# Patient Record
Sex: Female | Born: 1970 | Race: White | Hispanic: No | Smoking: Former smoker
Health system: Southern US, Community
[De-identification: ages and names within clinical notes are randomized; demographics above are authoritative.]

## PROBLEM LIST (undated history)

## (undated) DIAGNOSIS — J449 Chronic obstructive pulmonary disease, unspecified: Secondary | ICD-10-CM

## (undated) DIAGNOSIS — M1990 Unspecified osteoarthritis, unspecified site: Secondary | ICD-10-CM

## (undated) DIAGNOSIS — G43909 Migraine, unspecified, not intractable, without status migrainosus: Secondary | ICD-10-CM

## (undated) DIAGNOSIS — F319 Bipolar disorder, unspecified: Secondary | ICD-10-CM

## (undated) DIAGNOSIS — M6530 Trigger finger, unspecified finger: Secondary | ICD-10-CM

## (undated) DIAGNOSIS — G5600 Carpal tunnel syndrome, unspecified upper limb: Secondary | ICD-10-CM

## (undated) DIAGNOSIS — R011 Cardiac murmur, unspecified: Secondary | ICD-10-CM

## (undated) DIAGNOSIS — J189 Pneumonia, unspecified organism: Secondary | ICD-10-CM

## (undated) DIAGNOSIS — M722 Plantar fascial fibromatosis: Secondary | ICD-10-CM

## (undated) HISTORY — PX: COLONOSCOPY: SHX174

## (undated) HISTORY — PX: DIAGNOSTIC LAPAROSCOPY: SUR761

## (undated) HISTORY — DX: Unspecified osteoarthritis, unspecified site: M19.90

---
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
## 1989-07-20 HISTORY — PX: TUBAL LIGATION: SHX77

## 2009-01-24 ENCOUNTER — Emergency Department (HOSPITAL_COMMUNITY): Admission: EM | Admit: 2009-01-24 | Discharge: 2009-01-24 | Payer: Self-pay | Admitting: Family Medicine

## 2009-04-19 ENCOUNTER — Emergency Department (HOSPITAL_COMMUNITY): Admission: EM | Admit: 2009-04-19 | Discharge: 2009-04-19 | Payer: Self-pay | Admitting: Emergency Medicine

## 2009-06-19 ENCOUNTER — Emergency Department (HOSPITAL_COMMUNITY): Admission: EM | Admit: 2009-06-19 | Discharge: 2009-06-19 | Payer: Self-pay | Admitting: Emergency Medicine

## 2010-10-21 LAB — CBC
HCT: 39 % (ref 36.0–46.0)
Hemoglobin: 13.4 g/dL (ref 12.0–15.0)
MCHC: 34.5 g/dL (ref 30.0–36.0)
MCV: 97 fL (ref 78.0–100.0)
RDW: 13.9 % (ref 11.5–15.5)

## 2010-10-21 LAB — URINALYSIS, ROUTINE W REFLEX MICROSCOPIC
Bilirubin Urine: NEGATIVE
Glucose, UA: NEGATIVE mg/dL
Hgb urine dipstick: NEGATIVE
Ketones, ur: NEGATIVE mg/dL
Nitrite: NEGATIVE
Protein, ur: NEGATIVE mg/dL
Specific Gravity, Urine: 1.023 (ref 1.005–1.030)
Urobilinogen, UA: 0.2 mg/dL (ref 0.0–1.0)
pH: 5.5 (ref 5.0–8.0)

## 2010-10-21 LAB — BASIC METABOLIC PANEL
BUN: 11 mg/dL (ref 6–23)
Chloride: 103 mEq/L (ref 96–112)
GFR calc non Af Amer: >60 mL/min (ref >60)
Glucose, Bld: 95 mg/dL (ref 70–99)
Potassium: 3.5 mEq/L (ref 3.5–5.1)

## 2010-10-21 LAB — GLUCOSE, CAPILLARY: Glucose-Capillary: 145 mg/dL — ABNORMAL HIGH (ref 70–99)

## 2010-10-21 LAB — DIFFERENTIAL
Basophils Absolute: 0 10*3/uL (ref 0.0–0.1)
Basophils Relative: 0 % (ref 0–1)
Eosinophils Absolute: 0.3 10*3/uL (ref 0.0–0.7)
Eosinophils Relative: 4 % (ref 0–5)
Lymphocytes Relative: 36 % (ref 12–46)
Lymphs Abs: 2.7 10*3/uL (ref 0.7–4.0)
Monocytes Absolute: 0.6 10*3/uL (ref 0.1–1.0)
Monocytes Relative: 8 % (ref 3–12)
Neutro Abs: 3.9 10*3/uL (ref 1.7–7.7)
Neutrophils Relative %: 52 % (ref 43–77)

## 2010-10-21 LAB — T4: T4, Total: 6.7 ug/dL (ref 5.0–12.5)

## 2010-10-21 LAB — URINE CULTURE: Colony Count: 65000

## 2010-10-21 LAB — PREGNANCY, URINE

## 2010-12-07 ENCOUNTER — Emergency Department (HOSPITAL_COMMUNITY)
Admission: EM | Admit: 2010-12-07 | Discharge: 2010-12-07 | Disposition: A | Discharge status: Home / Self Care | Payer: Self-pay | Attending: Emergency Medicine | Admitting: Emergency Medicine

## 2010-12-07 DIAGNOSIS — H6120 Impacted cerumen, unspecified ear: Secondary | ICD-10-CM | POA: Insufficient documentation

## 2010-12-07 DIAGNOSIS — J329 Chronic sinusitis, unspecified: Secondary | ICD-10-CM | POA: Insufficient documentation

## 2011-02-03 ENCOUNTER — Emergency Department (HOSPITAL_COMMUNITY)
Admission: EM | Admit: 2011-02-03 | Discharge: 2011-02-03 | Disposition: A | Discharge status: Home / Self Care | Payer: PRIVATE HEALTH INSURANCE | Attending: Emergency Medicine | Admitting: Emergency Medicine

## 2011-02-03 ENCOUNTER — Emergency Department (HOSPITAL_COMMUNITY): Payer: PRIVATE HEALTH INSURANCE

## 2011-02-03 DIAGNOSIS — M545 Low back pain, unspecified: Secondary | ICD-10-CM | POA: Insufficient documentation

## 2011-02-03 LAB — URINALYSIS, ROUTINE W REFLEX MICROSCOPIC
Glucose, UA: NEGATIVE mg/dL
Ketones, ur: NEGATIVE mg/dL
Leukocytes, UA: NEGATIVE
Specific Gravity, Urine: 1.026 (ref 1.005–1.030)
pH: 6 (ref 5.0–8.0)

## 2012-02-14 ENCOUNTER — Emergency Department (HOSPITAL_COMMUNITY)
Admission: EM | Admit: 2012-02-14 | Discharge: 2012-02-14 | Discharge status: Against Medical Advice | Payer: PRIVATE HEALTH INSURANCE | Attending: Emergency Medicine | Admitting: Emergency Medicine

## 2012-02-14 ENCOUNTER — Encounter (HOSPITAL_COMMUNITY): Payer: Self-pay | Admitting: Emergency Medicine

## 2012-02-14 NOTE — ED Notes (Signed)
Per Chyrl Civatte, patient advocate, pt left prior to being seen by triage RN.

## 2012-02-14 NOTE — ED Notes (Deleted)
C/o pain to lower back and abd x 3 days.  Also c/o burning with urination.

## 2012-02-14 NOTE — ED Notes (Signed)
Triage information entered on wrong pt.  Corrected.  This pt has not been seen by this RN.

## 2012-11-29 ENCOUNTER — Encounter (HOSPITAL_COMMUNITY): Payer: Self-pay | Admitting: *Deleted

## 2012-11-29 ENCOUNTER — Emergency Department (HOSPITAL_COMMUNITY)
Admission: EM | Admit: 2012-11-29 | Discharge: 2012-11-29 | Disposition: A | Discharge status: Home / Self Care | Payer: PRIVATE HEALTH INSURANCE | Source: Home / Self Care | Attending: Family Medicine | Admitting: Family Medicine

## 2012-11-29 DIAGNOSIS — J302 Other seasonal allergic rhinitis: Secondary | ICD-10-CM

## 2012-11-29 DIAGNOSIS — J309 Allergic rhinitis, unspecified: Secondary | ICD-10-CM

## 2012-11-29 MED ORDER — FEXOFENADINE HCL 180 MG PO TABS: 180.0000 mg | ORAL_TABLET | Freq: Every day | ORAL | Status: DC

## 2012-11-29 MED ORDER — TRIAMCINOLONE ACETONIDE 40 MG/ML IJ SUSP
INTRAMUSCULAR | Status: AC
  Filled 2012-11-29: qty 5

## 2012-11-29 MED ORDER — FLUTICASONE PROPIONATE 50 MCG/ACT NA SUSP: 1.0000 | Freq: Two times a day (BID) | NASAL | Status: DC

## 2012-11-29 MED ORDER — METHYLPREDNISOLONE ACETATE 40 MG/ML IJ SUSP
80.0000 mg | Freq: Once | INTRAMUSCULAR | Status: AC
  Administered 2012-11-29: 80 mg via INTRAMUSCULAR

## 2012-11-29 MED ORDER — TRIAMCINOLONE ACETONIDE 40 MG/ML IJ SUSP
40.0000 mg | Freq: Once | INTRAMUSCULAR | Status: AC
  Administered 2012-11-29: 40 mg via INTRAMUSCULAR

## 2012-11-29 NOTE — ED Notes (Signed)
Onset yesterday swelling and  sore throat.  Today she vomited x 1  And she saw some tinged sputum.  She denies fever at home

## 2012-11-29 NOTE — ED Provider Notes (Signed)
History     CSN: [id number]  Arrival date & time 11/29/12  1145   First MD Initiated Contact with Patient 11/29/12 1250      Chief Complaint  Patient presents with  . Sore Throat    (Consider location/radiation/quality/duration/timing/severity/associated sxs/prior treatment) Patient is a 42 y.o. female presenting with pharyngitis. The history is provided by the patient.  Sore Throat This is a new problem. The current episode started more than 2 days ago. The problem has not changed since onset.The symptoms are aggravated by swallowing.    History reviewed. No pertinent past medical history.  Past Surgical History  Procedure Laterality Date  . Cesarean section      No family history on file.  History  Substance Use Topics  . Smoking status: Current Every Day Smoker -- 2.00 packs/day    Types: Cigarettes  . Smokeless tobacco: Not on file  . Alcohol Use: No    OB History   Grav Para Term Preterm Abortions TAB SAB Ect Mult Living                  Review of Systems  Constitutional: Negative.  Negative for fever and chills.  HENT: Positive for congestion, rhinorrhea and postnasal drip.   Respiratory: Negative.   Cardiovascular: Negative.     Allergies  Review of patient's allergies indicates no known allergies.  Home Medications  No current outpatient prescriptions on file.  BP 120/85  Pulse 85  Temp(Src) 98.2 F (36.8 C) (Oral)  Resp 18  SpO2 96%  LMP 11/28/2012  Physical Exam  Nursing note and vitals reviewed. Constitutional: She is oriented to person, place, and time. She appears well-developed and well-nourished.  HENT:  Head: Normocephalic.  Right Ear: External ear normal.  Nose: Mucosal edema and rhinorrhea present.  Mouth/Throat: Oropharynx is clear and moist.  Neck: Normal range of motion. Neck supple.  Cardiovascular: Normal rate and regular rhythm.   Pulmonary/Chest: Breath sounds normal.  Lymphadenopathy:    She has no cervical  adenopathy.  Neurological: She is alert and oriented to person, place, and time.  Skin: Skin is warm and dry.    ED Course  Procedures (including critical care time)  Labs Reviewed - No data to display No results found.   No diagnosis found.    MDM          Linna Hoff, MD 11/29/12 1311

## 2013-01-10 ENCOUNTER — Encounter (HOSPITAL_COMMUNITY): Payer: Self-pay | Admitting: Emergency Medicine

## 2013-01-10 ENCOUNTER — Emergency Department (HOSPITAL_COMMUNITY)
Admission: EM | Admit: 2013-01-10 | Discharge: 2013-01-10 | Disposition: A | Discharge status: Home / Self Care | Payer: PRIVATE HEALTH INSURANCE | Source: Home / Self Care

## 2013-01-10 ENCOUNTER — Encounter (HOSPITAL_COMMUNITY): Payer: Self-pay

## 2013-01-10 ENCOUNTER — Emergency Department (HOSPITAL_COMMUNITY): Payer: PRIVATE HEALTH INSURANCE

## 2013-01-10 ENCOUNTER — Emergency Department (HOSPITAL_COMMUNITY)
Admission: EM | Admit: 2013-01-10 | Discharge: 2013-01-10 | Disposition: A | Discharge status: Home / Self Care | Payer: PRIVATE HEALTH INSURANCE | Attending: Emergency Medicine | Admitting: Emergency Medicine

## 2013-01-10 DIAGNOSIS — IMO0002 Reserved for concepts with insufficient information to code with codable children: Secondary | ICD-10-CM | POA: Insufficient documentation

## 2013-01-10 DIAGNOSIS — R110 Nausea: Secondary | ICD-10-CM | POA: Insufficient documentation

## 2013-01-10 DIAGNOSIS — K8020 Calculus of gallbladder without cholecystitis without obstruction: Secondary | ICD-10-CM | POA: Insufficient documentation

## 2013-01-10 DIAGNOSIS — F17210 Nicotine dependence, cigarettes, uncomplicated: Secondary | ICD-10-CM

## 2013-01-10 DIAGNOSIS — R0609 Other forms of dyspnea: Secondary | ICD-10-CM | POA: Insufficient documentation

## 2013-01-10 DIAGNOSIS — R079 Chest pain, unspecified: Secondary | ICD-10-CM

## 2013-01-10 DIAGNOSIS — R0989 Other specified symptoms and signs involving the circulatory and respiratory systems: Secondary | ICD-10-CM | POA: Insufficient documentation

## 2013-01-10 DIAGNOSIS — R1013 Epigastric pain: Secondary | ICD-10-CM

## 2013-01-10 DIAGNOSIS — F17200 Nicotine dependence, unspecified, uncomplicated: Secondary | ICD-10-CM | POA: Insufficient documentation

## 2013-01-10 LAB — COMPREHENSIVE METABOLIC PANEL
ALT: 11 U/L (ref 0–35)
AST: 14 U/L (ref 0–37)
Albumin: 3.4 g/dL — ABNORMAL LOW (ref 3.5–5.2)
Alkaline Phosphatase: 68 U/L (ref 39–117)
BUN: 12 mg/dL (ref 6–23)
Potassium: 4.4 mEq/L (ref 3.5–5.1)
Sodium: 139 mEq/L (ref 135–145)
Total Protein: 6.8 g/dL (ref 6.0–8.3)

## 2013-01-10 LAB — CBC
Platelets: 192 10*3/uL (ref 150–400)
RDW: 13.9 % (ref 11.5–15.5)
WBC: 8.9 10*3/uL (ref 4.0–10.5)

## 2013-01-10 LAB — LIPASE, BLOOD: Lipase: 58 U/L (ref 11–59)

## 2013-01-10 LAB — TROPONIN I: Troponin I: <0.3 ng/mL (ref <0.30)

## 2013-01-10 MED ORDER — ALBUTEROL SULFATE (5 MG/ML) 0.5% IN NEBU
5.0000 mg | INHALATION_SOLUTION | Freq: Once | RESPIRATORY_TRACT | Status: AC
  Administered 2013-01-10: 5 mg via RESPIRATORY_TRACT
  Filled 2013-01-10 (×2): qty 1

## 2013-01-10 MED ORDER — GI COCKTAIL ~~LOC~~
30.0000 mL | Freq: Once | ORAL | Status: AC
  Administered 2013-01-10: 30 mL via ORAL
  Filled 2013-01-10: qty 30

## 2013-01-10 MED ORDER — ASPIRIN 81 MG PO CHEW
CHEWABLE_TABLET | ORAL | Status: AC
  Filled 2013-01-10: qty 4

## 2013-01-10 MED ORDER — ONDANSETRON HCL 4 MG/2ML IJ SOLN
4.0000 mg | Freq: Once | INTRAMUSCULAR | Status: AC
  Administered 2013-01-10: 4 mg via INTRAVENOUS
  Filled 2013-01-10: qty 2

## 2013-01-10 MED ORDER — OXYCODONE-ACETAMINOPHEN 5-325 MG PO TABS: 1.0000 | ORAL_TABLET | ORAL | Status: DC | PRN

## 2013-01-10 MED ORDER — MORPHINE SULFATE 4 MG/ML IJ SOLN
6.0000 mg | Freq: Once | INTRAMUSCULAR | Status: AC
  Administered 2013-01-10: 6 mg via INTRAVENOUS
  Filled 2013-01-10: qty 2

## 2013-01-10 MED ORDER — MORPHINE SULFATE 4 MG/ML IJ SOLN
6.0000 mg | Freq: Once | INTRAMUSCULAR | Status: DC
  Filled 2013-01-10: qty 2

## 2013-01-10 MED ORDER — ONDANSETRON 8 MG PO TBDP: 8.0000 mg | ORAL_TABLET | Freq: Three times a day (TID) | ORAL | Status: DC | PRN

## 2013-01-10 MED ORDER — ASPIRIN 81 MG PO CHEW
324.0000 mg | CHEWABLE_TABLET | Freq: Once | ORAL | Status: AC
  Administered 2013-01-10: 324 mg via ORAL

## 2013-01-10 MED ORDER — SODIUM CHLORIDE 0.9 % IV SOLN
Freq: Once | INTRAVENOUS | Status: AC
  Administered 2013-01-10: 12:00:00 via INTRAVENOUS

## 2013-01-10 NOTE — ED Provider Notes (Signed)
History    CSN: [id number] Arrival date & time 01/10/13  1215  First MD Initiated Contact with Patient 01/10/13 1219     Chief Complaint  Patient presents with  . Chest Pain   HPI Patient presents of worsening upper abdominal pain over the past 3-4 days with associated nausea.  She denies vomiting.  No diarrhea.  No fevers or chills.  She reports eating food seems to make her pain worse.  She's never been told she has gallstones before.  No history of cardiac disease.  No chest pain or shortness of breath.  Symptoms are mild to moderate in severity.  Symptoms seem to be improving at this time.  She's had the urge of care earlier today and transferred here for questionable cardiac related chest pain.  EKG normal sinus rhythm on arrival to ER.   History reviewed. No pertinent past medical history. Past Surgical History  Procedure Laterality Date  . Cesarean section     No family history on file. History  Substance Use Topics  . Smoking status: Current Every Day Smoker -- 2.00 packs/day    Types: Cigarettes  . Smokeless tobacco: Not on file  . Alcohol Use: No   OB History   Grav Para Term Preterm Abortions TAB SAB Ect Mult Living                 Review of Systems  All other systems reviewed and are negative.    Allergies  Review of patient's allergies indicates no known allergies.  Home Medications   Current Outpatient Rx  Name  Route  Sig  Dispense  Refill  . fexofenadine (ALLEGRA) 180 MG tablet   Oral   Take 1 tablet (180 mg total) by mouth daily.   30 tablet   1   . fluticasone (FLONASE) 50 MCG/ACT nasal spray   Nasal   Place 1 spray into the nose 2 (two) times daily.   1 g   2    BP 108/68  Pulse 69  Temp(Src) 97.9 F (36.6 C) (Oral)  Resp 14  SpO2 100%  LMP 01/10/2013 Physical Exam  Nursing note and vitals reviewed. Constitutional: She is oriented to person, place, and time. She appears well-developed and well-nourished. No distress.  HENT:   Head: Normocephalic and atraumatic.  Eyes: EOM are normal.  Neck: Normal range of motion.  Cardiovascular: Normal rate, regular rhythm and normal heart sounds.   Pulmonary/Chest: Effort normal and breath sounds normal.  Abdominal: Soft. She exhibits no distension. There is no tenderness.  Musculoskeletal: Normal range of motion.  Neurological: She is alert and oriented to person, place, and time.  Skin: Skin is warm and dry.  Psychiatric: She has a normal mood and affect. Judgment normal.    ED Course  Procedures (including critical care time)   Date: 01/10/2013  Rate: 64  Rhythm: normal sinus rhythm  QRS Axis: normal  Intervals: normal  ST/T Wave abnormalities: normal  Conduction Disutrbances: none  Narrative Interpretation:   Old EKG Reviewed: No significant changes noted     Labs Reviewed  COMPREHENSIVE METABOLIC PANEL - Abnormal; Notable for the following:    Albumin 3.4 (*)    Total Bilirubin 0.1 (*)    All other components within normal limits  CBC  TROPONIN I  LIPASE, BLOOD   Dg Chest 2 View  01/10/2013   *RADIOLOGY REPORT*  Clinical Data: 4 days of chest pain.  CHEST - 2 VIEW  Comparison: 06/19/2009.  Findings:  Chronic bronchitic changes.  Cardiopericardial silhouette and mediastinal contours appear within normal limits.  No airspace disease or pleural effusion.  No focal consolidation.  IMPRESSION: Chronic bronchitic changes.  No acute cardiopulmonary disease.   Original Report Authenticated By: Andreas Newport, M.D.   US Abdomen Complete  01/10/2013   *RADIOLOGY REPORT*  Clinical Data:  Abdominal pain  ABDOMINAL ULTRASOUND COMPLETE  Comparison:  None.  Findings:  Gallbladder:  08/03 0.2 cm gallstone is present towards the neck of the gallbladder.  No wall thickening or pericholecystic fluid.  No Murphy's sign.  Common Bile Duct:  Within normal limits in caliber.  Liver: No focal mass lesion identified.  Within normal limits in parenchymal echogenicity.  IVC:   Appears normal.  Pancreas:  The pancreas was completely obscured by overlying bowel gas.  Spleen:  Within normal limits in size and echotexture.  Right kidney:  Normal in size and parenchymal echogenicity.  No evidence of mass or hydronephrosis.  Left kidney:  Normal in size and parenchymal echogenicity.  No evidence of mass or hydronephrosis.  Abdominal Aorta:  Portions of the aorta were obscured by overlying bowel gas.  No obvious aneurysm.  IMPRESSION: Cholelithiasis.  Pancreas and portions of the aorta were obscured.   Original Report Authenticated By: Jolaine Click, M.D.   I personally reviewed the imaging tests through PACS system I reviewed available ER/hospitalization records through the EMR   1. Cholelithiasis     MDM  4:56 PM Patient feels much better at this time.  This is cholelithiasis without evidence of cholecystitis.  Feeling better at this time.  General surgery followup.  She understands return to the ER for new or worsening symptoms.  Lyanne Co, MD 01/10/13 [phone]

## 2013-01-10 NOTE — ED Notes (Addendum)
Pain in middle of chest Head feels swimmy headed.   Pain started 4 days ago, pain is constant, feels "pressure".  Reports nausea no vomiting.

## 2013-01-10 NOTE — ED Provider Notes (Signed)
   History    CSN: [id number] Arrival date & time 01/10/13  1038  First MD Initiated Contact with Patient 01/10/13 1055     Chief Complaint  Patient presents with  . Chest Pain   (Consider location/radiation/quality/duration/timing/severity/associated sxs/prior Treatment) HPI Comments: 42 year old female presents today with a four-day history of anterior chest pressure. She complains of pain across the anterior chest and high in the epigastrium. She states it is primarily substernal. She occasionally has episodes of diaphoresis and lightheadedness. The pain is often worse with movement lifting and exertion. She has been smoking 1-1/2 packs per day for 30 years.  History reviewed. No pertinent past medical history. Past Surgical History  Procedure Laterality Date  . Cesarean section     No family history on file. History  Substance Use Topics  . Smoking status: Current Every Day Smoker -- 2.00 packs/day    Types: Cigarettes  . Smokeless tobacco: Not on file  . Alcohol Use: No   OB History   Grav Para Term Preterm Abortions TAB SAB Ect Mult Living                 Review of Systems  Constitutional: Positive for activity change. Negative for fever.  HENT: Negative.   Respiratory: Positive for chest tightness and shortness of breath. Negative for cough.   Cardiovascular: Positive for chest pain. Negative for leg swelling.  Gastrointestinal: Positive for nausea. Negative for vomiting.  Genitourinary: Negative.   Skin: Negative.   Neurological: Positive for dizziness and light-headedness. Negative for syncope and speech difficulty.    Allergies  Review of patient's allergies indicates no known allergies.  Home Medications   Current Outpatient Rx  Name  Route  Sig  Dispense  Refill  . fexofenadine (ALLEGRA) 180 MG tablet   Oral   Take 1 tablet (180 mg total) by mouth daily.   30 tablet   1   . fluticasone (FLONASE) 50 MCG/ACT nasal spray   Nasal   Place 1 spray into  the nose 2 (two) times daily.   1 g   2    BP 127/85  Pulse 88  Temp(Src) 98.4 F (36.9 C) (Oral)  Resp 18  SpO2 97%  LMP 01/10/2013 Physical Exam  Nursing note and vitals reviewed. Constitutional: She is oriented to person, place, and time. She appears well-developed and well-nourished. No distress.  Neck: Normal range of motion. Neck supple.  Cardiovascular: Normal rate, regular rhythm and normal heart sounds.   Pulmonary/Chest: Effort normal. No respiratory distress. She has no wheezes.  Abdominal: She exhibits no mass. There is tenderness.  There is tenderness high in the epigastrium.  Musculoskeletal:  No reproducible chest wall tenderness.  Neurological: She is alert and oriented to person, place, and time.  Skin: Skin is warm and dry. No rash noted.    ED Course  Procedures (including critical care time) Labs Reviewed - No data to display No results found. 1. Chest pain   2. Epigastric pain   3. Cigarette smoker two packs a day or less     MDM  42 year old female who is a current smoker now for over 30 years is complaining of anterior chest pressure and heaviness for the past 4 days. She has had some nausea but no vomiting as well as diaphoresis and lightheadedness. She is to be transferred to the emergency department for chest pain evaluation. Currently stable. EKG: NSR. No ST-T changes. No ischemic changes. Normal axis  Hayden Rasmussen, NP 01/10/13 1133

## 2013-01-10 NOTE — ED Notes (Signed)
Patient transported to Ultrasound 

## 2013-01-10 NOTE — ED Notes (Addendum)
Pt c/o of CP worsening over the last 4 days.  Pt presented to [facility] today and transferred to ED.  Pt A and O x 4.  Pt given 324 mg Aspirin at [facility].

## 2013-01-10 NOTE — ED Notes (Signed)
Notified [facility] 

## 2013-01-23 ENCOUNTER — Ambulatory Visit (INDEPENDENT_AMBULATORY_CARE_PROVIDER_SITE_OTHER): Payer: PRIVATE HEALTH INSURANCE | Admitting: Surgery

## 2013-01-23 ENCOUNTER — Encounter (INDEPENDENT_AMBULATORY_CARE_PROVIDER_SITE_OTHER): Payer: Self-pay | Admitting: Surgery

## 2013-01-23 VITALS — BP 118/70 | HR 72 | Temp 99.0°F | Resp 16 | Ht 64.0 in | Wt 193.0 lb

## 2013-01-23 DIAGNOSIS — K8010 Calculus of gallbladder with chronic cholecystitis without obstruction: Secondary | ICD-10-CM | POA: Insufficient documentation

## 2013-01-23 NOTE — Progress Notes (Signed)
Patient ID: Crystal Fuentes, female   DOB: 10/19/1970, 42 y.o.   MRN: [id number]  Chief Complaint  Patient presents with  . New Evaluation    eval chole    HPI Crystal Fuentes is a 42 y.o. female.  Referred by Dr. Azalia Bilis - [place] for gallbladder disease HPI This is a 42 year old female who presents with a two-week history of severe epigastric pain with radiation around her right side to her back. This was associated with nausea and abdominal bloating. She denies any diarrhea. She actually has been constipated. She was evaluated at [facility] part was found to have a gallstone but no sign cholecystitis. She continues to have some abdominal pain that is exacerbated by eating.  Past Medical History  Diagnosis Date  . Arthritis     Past Surgical History  Procedure Laterality Date  . Cesarean section    . Colonoscopy  [id number]    Family History  Problem Relation Age of Onset  . Heart disease Mother   . Hypertension Mother   . Hyperlipidemia Mother   . Arthritis Mother   . Asthma Mother   . Cancer Father   . Hypertension Father   . Hyperlipidemia Father   . Hypertension Sister   . Hyperlipidemia Sister   . Hypertension Brother   . Hyperlipidemia Brother     Social History History  Substance Use Topics  . Smoking status: Current Some Day Smoker -- 1.50 packs/day    Types: Cigarettes  . Smokeless tobacco: Never Used  . Alcohol Use: No    No Known Allergies  Current Outpatient Prescriptions  Medication Sig Dispense Refill  . ondansetron (ZOFRAN ODT) 8 MG disintegrating tablet Take 1 tablet (8 mg total) by mouth every 8 (eight) hours as needed for nausea.  12 tablet  0  . oxyCODONE-acetaminophen (PERCOCET/ROXICET) 5-325 MG per tablet Take 1 tablet by mouth every 4 (four) hours as needed for pain.  20 tablet  0   No current facility-administered medications for this visit.    Review of Systems Review of Systems  Constitutional: Negative for fever, chills and unexpected weight  change.  HENT: Negative for hearing loss, congestion, sore throat, trouble swallowing and voice change.   Eyes: Negative for visual disturbance.  Respiratory: Negative for cough and wheezing.   Cardiovascular: Negative for chest pain, palpitations and leg swelling.  Gastrointestinal: Positive for nausea, abdominal pain, constipation and abdominal distention. Negative for vomiting, diarrhea, blood in stool and anal bleeding.  Genitourinary: Negative for hematuria, vaginal bleeding and difficulty urinating.  Musculoskeletal: Negative for arthralgias.  Skin: Negative for rash and wound.  Neurological: Negative for seizures, syncope and headaches.  Hematological: Negative for adenopathy. Does not bruise/bleed easily.  Psychiatric/Behavioral: Negative for confusion.    Blood pressure 118/70, pulse 72, temperature 99 F (37.2 C), temperature source Temporal, resp. rate 16, height 5\' 4"  (1.626 m), weight 193 lb (87.544 kg), last menstrual period 01/10/2013.  Physical Exam Physical Exam WDWN in NAD HEENT:  EOMI, sclera anicteric Neck:  No masses, no thyromegaly Lungs:  CTA bilaterally; normal respiratory effort CV:  Regular rate and rhythm; no murmurs Abd:  +bowel sounds, soft, mild epigastric and RUQ tenderness Ext:  Well-perfused; no edema Skin:  Warm, dry; no sign of jaundice  Data Reviewed Lab Results  Component Value Date   WBC 8.9 01/10/2013   HGB 13.4 01/10/2013   HCT 39.1 01/10/2013   MCV 93.5 01/10/2013   PLT 192 01/10/2013   Lab Results  Component Value  Date   ALT 11 01/10/2013   AST 14 01/10/2013   ALKPHOS 68 01/10/2013   BILITOT 0.1* 01/10/2013   Lab Results  Component Value Date   CREATININE 0.67 01/10/2013   BUN 12 01/10/2013   NA 139 01/10/2013   K 4.4 01/10/2013   CL 104 01/10/2013   CO2 28 01/10/2013   *RADIOLOGY REPORT*  Clinical Data: Abdominal pain  ABDOMINAL ULTRASOUND COMPLETE  Comparison: None.  Findings:  Gallbladder: 08/03 0.2 cm gallstone is present  towards the neck of  the gallbladder. No wall thickening or pericholecystic fluid. No  Murphy's sign.  Common Bile Duct: Within normal limits in caliber.  Liver: No focal mass lesion identified. Within normal limits in  parenchymal echogenicity.  IVC: Appears normal.  Pancreas: The pancreas was completely obscured by overlying bowel  gas.  Spleen: Within normal limits in size and echotexture.  Right kidney: Normal in size and parenchymal echogenicity. No  evidence of mass or hydronephrosis.  Left kidney: Normal in size and parenchymal echogenicity. No  evidence of mass or hydronephrosis.  Abdominal Aorta: Portions of the aorta were obscured by overlying  bowel gas. No obvious aneurysm.  IMPRESSION:  Cholelithiasis.  Pancreas and portions of the aorta were obscured.  Original Report Authenticated By: Jolaine Click, M.D.      IMP:  Chronic calculus cholecystitis     Plan    Laparoscopic cholecystectomy with intraoperative cholangiogram. The surgical procedure has been discussed with the patient.  Potential risks, benefits, alternative treatments, and expected outcomes have been explained.  All of the patient's questions at this time have been answered.  The likelihood of reaching the patient's treatment goal is good.  The patient understand the proposed surgical procedure and wishes to proceed.         Harrington Jobe K. 01/23/2013, 2:14 PM

## 2013-01-24 NOTE — ED Provider Notes (Signed)
Medical screening examination/treatment/procedure(s) were performed by resident physician or non-physician practitioner and as supervising physician I was immediately available for consultation/collaboration.   Marcin Holte DOUGLAS MD.   Jaxten Brosh D Anjolie Majer, MD 01/24/13 0841 

## 2013-01-26 ENCOUNTER — Encounter (HOSPITAL_COMMUNITY): Payer: Self-pay

## 2013-01-26 ENCOUNTER — Emergency Department (HOSPITAL_COMMUNITY)
Admission: EM | Admit: 2013-01-26 | Discharge: 2013-01-26 | Disposition: A | Discharge status: Home / Self Care | Payer: PRIVATE HEALTH INSURANCE | Attending: Emergency Medicine | Admitting: Emergency Medicine

## 2013-01-26 ENCOUNTER — Telehealth (INDEPENDENT_AMBULATORY_CARE_PROVIDER_SITE_OTHER): Payer: Self-pay

## 2013-01-26 DIAGNOSIS — K8050 Calculus of bile duct without cholangitis or cholecystitis without obstruction: Secondary | ICD-10-CM

## 2013-01-26 DIAGNOSIS — F17200 Nicotine dependence, unspecified, uncomplicated: Secondary | ICD-10-CM | POA: Insufficient documentation

## 2013-01-26 DIAGNOSIS — K8020 Calculus of gallbladder without cholecystitis without obstruction: Secondary | ICD-10-CM | POA: Insufficient documentation

## 2013-01-26 DIAGNOSIS — M1990 Unspecified osteoarthritis, unspecified site: Secondary | ICD-10-CM | POA: Insufficient documentation

## 2013-01-26 DIAGNOSIS — Z3202 Encounter for pregnancy test, result negative: Secondary | ICD-10-CM | POA: Insufficient documentation

## 2013-01-26 LAB — COMPREHENSIVE METABOLIC PANEL
Albumin: 3.7 g/dL (ref 3.5–5.2)
BUN: 17 mg/dL (ref 6–23)
CO2: 30 mEq/L (ref 19–32)
Chloride: 103 mEq/L (ref 96–112)
Creatinine, Ser: 0.7 mg/dL (ref 0.50–1.10)
GFR calc Af Amer: >90 mL/min (ref >90)
GFR calc non Af Amer: >90 mL/min (ref >90)
Total Bilirubin: 0.1 mg/dL — ABNORMAL LOW (ref 0.3–1.2)

## 2013-01-26 LAB — CBC WITH DIFFERENTIAL/PLATELET
Basophils Relative: 0 % (ref 0–1)
HCT: 38.2 % (ref 36.0–46.0)
Hemoglobin: 13.3 g/dL (ref 12.0–15.0)
MCHC: 34.8 g/dL (ref 30.0–36.0)
Monocytes Absolute: 0.7 10*3/uL (ref 0.1–1.0)
Monocytes Relative: 7 % (ref 3–12)
Neutro Abs: 6 10*3/uL (ref 1.7–7.7)

## 2013-01-26 LAB — URINE MICROSCOPIC-ADD ON

## 2013-01-26 LAB — URINALYSIS, ROUTINE W REFLEX MICROSCOPIC
Ketones, ur: 15 mg/dL — AB
Leukocytes, UA: NEGATIVE
Nitrite: NEGATIVE
Protein, ur: NEGATIVE mg/dL
Urobilinogen, UA: 1 mg/dL (ref 0.0–1.0)

## 2013-01-26 LAB — LIPASE, BLOOD: Lipase: 49 U/L (ref 11–59)

## 2013-01-26 MED ORDER — ONDANSETRON HCL 4 MG/2ML IJ SOLN
4.0000 mg | Freq: Once | INTRAMUSCULAR | Status: DC
  Filled 2013-01-26: qty 2

## 2013-01-26 MED ORDER — OXYCODONE-ACETAMINOPHEN 5-325 MG PO TABS: 1.0000 | ORAL_TABLET | ORAL | Status: DC | PRN

## 2013-01-26 MED ORDER — MORPHINE SULFATE 4 MG/ML IJ SOLN
4.0000 mg | Freq: Once | INTRAMUSCULAR | Status: DC
  Filled 2013-01-26: qty 1

## 2013-01-26 MED ORDER — MORPHINE SULFATE 4 MG/ML IJ SOLN
4.0000 mg | Freq: Once | INTRAMUSCULAR | Status: AC
  Administered 2013-01-26: 4 mg via INTRAVENOUS

## 2013-01-26 NOTE — ED Provider Notes (Signed)
History    CSN: [id number] Arrival date & time 01/26/13  1410  First MD Initiated Contact with Patient 01/26/13 1610     No chief complaint on file.  (Consider location/radiation/quality/duration/timing/severity/associated sxs/prior Treatment) HPI Crystal Fuentes is a 42 y.o. female who presents to ED with complaint of right upper abdominal pain. States pain started originally several weeks ago. States was seen here, diagnosed with cholelithiasis. States has an apt for 7/23 to have gallbladder removed. States pain medications for her pain, but since last night, pain has worsened. Pt reports pain is sharp. Admits to nausea, and vomiting. Last normal bowel movement yesterday. Pt denies fever, chills, malaise. No other complaints.   Past Medical History  Diagnosis Date  . Arthritis    Past Surgical History  Procedure Laterality Date  . Cesarean section    . Colonoscopy  [id number]   Family History  Problem Relation Age of Onset  . Heart disease Mother   . Hypertension Mother   . Hyperlipidemia Mother   . Arthritis Mother   . Asthma Mother   . Cancer Father   . Hypertension Father   . Hyperlipidemia Father   . Hypertension Sister   . Hyperlipidemia Sister   . Hypertension Brother   . Hyperlipidemia Brother    History  Substance Use Topics  . Smoking status: Current Some Day Smoker -- 1.50 packs/day    Types: Cigarettes  . Smokeless tobacco: Never Used  . Alcohol Use: No   OB History   Grav Para Term Preterm Abortions TAB SAB Ect Mult Living                 Review of Systems  Constitutional: Negative for fever and chills.  HENT: Negative for neck pain and neck stiffness.   Respiratory: Negative.   Cardiovascular: Negative.   Gastrointestinal: Positive for nausea, vomiting and abdominal pain. Negative for diarrhea, constipation and blood in stool.  Genitourinary: Negative for dysuria.  Musculoskeletal: Negative.   Neurological: Negative for weakness and numbness.     Allergies  Review of patient's allergies indicates no known allergies.  Home Medications   Current Outpatient Rx  Name  Route  Sig  Dispense  Refill  . ondansetron (ZOFRAN ODT) 8 MG disintegrating tablet   Oral   Take 1 tablet (8 mg total) by mouth every 8 (eight) hours as needed for nausea.   12 tablet   0   . oxyCODONE-acetaminophen (PERCOCET/ROXICET) 5-325 MG per tablet   Oral   Take 1 tablet by mouth every 4 (four) hours as needed for pain.   20 tablet   0    BP 134/77  Pulse 86  Temp(Src) 98.9 F (37.2 C) (Oral)  Resp 16  SpO2 96%  LMP 01/10/2013 Physical Exam  Nursing note and vitals reviewed. Constitutional: She is oriented to person, place, and time. She appears well-developed and well-nourished. No distress.  Eyes: Conjunctivae are normal.  Neck: Neck supple.  Cardiovascular: Normal rate, regular rhythm and normal heart sounds.   Pulmonary/Chest: Effort normal and breath sounds normal. No respiratory distress. She has no wheezes. She has no rales.  Abdominal: Soft. Bowel sounds are normal. She exhibits no distension. There is tenderness. There is rebound. There is no guarding.  RUQ tenderness  Musculoskeletal: She exhibits no edema.  Neurological: She is alert and oriented to person, place, and time.    ED Course  Procedures (including critical care time  Results for orders placed during the hospital encounter of 01/26/13  CBC WITH DIFFERENTIAL      Result Value Range   WBC 9.7  4.0 - 10.5 K/uL   RBC 4.07  3.87 - 5.11 MIL/uL   Hemoglobin 13.3  12.0 - 15.0 g/dL   HCT 16.1  09.6 - 04.5 %   MCV 93.9  78.0 - 100.0 fL   MCH 32.7  26.0 - 34.0 pg   MCHC 34.8  30.0 - 36.0 g/dL   RDW 40.9  81.1 - 91.4 %   Platelets 188  150 - 400 K/uL   Neutrophils Relative % 61  43 - 77 %   Neutro Abs 6.0  1.7 - 7.7 K/uL   Lymphocytes Relative 29  12 - 46 %   Lymphs Abs 2.8  0.7 - 4.0 K/uL   Monocytes Relative 7  3 - 12 %   Monocytes Absolute 0.7  0.1 - 1.0 K/uL    Eosinophils Relative 3  0 - 5 %   Eosinophils Absolute 0.2  0.0 - 0.7 K/uL   Basophils Relative 0  0 - 1 %   Basophils Absolute 0.0  0.0 - 0.1 K/uL  COMPREHENSIVE METABOLIC PANEL      Result Value Range   Sodium 139  135 - 145 mEq/L   Potassium 4.4  3.5 - 5.1 mEq/L   Chloride 103  96 - 112 mEq/L   CO2 30  19 - 32 mEq/L   Glucose, Bld 81  70 - 99 mg/dL   BUN 17  6 - 23 mg/dL   Creatinine, Ser 7.82  0.50 - 1.10 mg/dL   Calcium 9.5  8.4 - 95.6 mg/dL   Total Protein 7.3  6.0 - 8.3 g/dL   Albumin 3.7  3.5 - 5.2 g/dL   AST 14  0 - 37 U/L   ALT 11  0 - 35 U/L   Alkaline Phosphatase 69  39 - 117 U/L   Total Bilirubin 0.1 (*) 0.3 - 1.2 mg/dL   GFR calc non Af Amer >90  >90 mL/min   GFR calc Af Amer >90  >90 mL/min  LIPASE, BLOOD      Result Value Range   Lipase 49  11 - 59 U/L  URINALYSIS, ROUTINE W REFLEX MICROSCOPIC      Result Value Range   Color, Urine AMBER (*) YELLOW   APPearance HAZY (*) CLEAR   Specific Gravity, Urine 1.037 (*) 1.005 - 1.030   pH 6.0  5.0 - 8.0   Glucose, UA NEGATIVE  NEGATIVE mg/dL   Hgb urine dipstick LARGE (*) NEGATIVE   Bilirubin Urine SMALL (*) NEGATIVE   Ketones, ur 15 (*) NEGATIVE mg/dL   Protein, ur NEGATIVE  NEGATIVE mg/dL   Urobilinogen, UA 1.0  0.0 - 1.0 mg/dL   Nitrite NEGATIVE  NEGATIVE   Leukocytes, UA NEGATIVE  NEGATIVE  URINE MICROSCOPIC-ADD ON      Result Value Range   Squamous Epithelial / LPF FEW (*) RARE   WBC, UA 0-2  <3 WBC/hpf   RBC / HPF 7-10  <3 RBC/hpf   Bacteria, UA FEW (*) RARE   Urine-Other MUCOUS PRESENT    POCT PREGNANCY, URINE      Result Value Range   Preg Test, Ur NEGATIVE  NEGATIVE        1. Biliary colic     MDM  Pt with known cholelithias, here with increased RUQ pain. Ran out of percocet today. Called Dr. Margaree Mackintosh, was told to come to ER. In ED, was given  4mg  of morphine IV, fluids given, pain improved. Pt states she does not need any more medications. Her LFTs and Lipase normal today, indicating most likely  no obstruction. Pt not vomiting in ED, tolerating POs. I discussed with surgery, Dr. Janee Morn, who advised if pain is well managed to d/c home with refill on percocet and follow up outpatient. Pt agrees with the plan. Stable for d/c home.   Filed Vitals:   01/26/13 1426 01/26/13 1815  BP: 134/77   Pulse: 86 64  Temp: 98.9 F (37.2 C)   TempSrc: Oral   Resp: 16   SpO2: 96% 95%      Lottie Mussel, PA-C 01/26/13 1945

## 2013-01-26 NOTE — ED Notes (Signed)
Pt st's she is scheduled to have gallbladder removed on 7/23.  St's she called surgeon due to increased pain and was told to come to ED.  Pt c/o pain in RUQ with nausea and no vomiting.

## 2013-01-26 NOTE — ED Notes (Signed)
Pt discharged.Vital signs stable and GCS 15 

## 2013-01-26 NOTE — Telephone Encounter (Signed)
Returned pt's call. I advised pt that I spoke to Dr Corliss Skains about her having so much pain with her gallbladder and he advised pt that if she is having that much pain requesting pain medication then she needs to go to the ER. The pt understands.

## 2013-01-26 NOTE — ED Notes (Addendum)
Pt presents with onset of R upper abdominal pain that began at 0315 this morning.  Pt is scheduled to have gallbladder removed on 7/23, was referred here by [facility].  +nausea and vomiting.  Pt reports LMP was 6/24, but x 2 weeks later, pt began having vaginal bleeding again, and is now spotting - denies any lower abdominal pain.

## 2013-01-28 NOTE — ED Provider Notes (Signed)
Medical screening examination/treatment/procedure(s) were performed by non-physician practitioner and as supervising physician I was immediately available for consultation/collaboration.  Hurman Horn, MD 01/28/13 2104

## 2013-01-31 ENCOUNTER — Encounter (HOSPITAL_COMMUNITY)
Admission: RE | Admit: 2013-01-31 | Discharge: 2013-01-31 | Disposition: A | Discharge status: Home / Self Care | Payer: PRIVATE HEALTH INSURANCE | Source: Ambulatory Visit | Attending: Surgery | Admitting: Surgery

## 2013-01-31 ENCOUNTER — Encounter (HOSPITAL_COMMUNITY): Payer: Self-pay | Admitting: Pharmacy Technician

## 2013-01-31 ENCOUNTER — Encounter (HOSPITAL_COMMUNITY): Payer: Self-pay

## 2013-01-31 HISTORY — DX: Pneumonia, unspecified organism: J18.9

## 2013-01-31 MED ORDER — CEFAZOLIN SODIUM-DEXTROSE 2-3 GM-% IV SOLR
2.0000 g | INTRAVENOUS | Status: AC
  Administered 2013-02-01: 2 g via INTRAVENOUS

## 2013-01-31 NOTE — Progress Notes (Signed)
Pt denies SOB, chest pain, and being under the care of a cardiologist. Pt denies having any cardiac studies performed such as stress test, echo, and cardiac cath.

## 2013-01-31 NOTE — Pre-Procedure Instructions (Signed)
Crystal Fuentes  01/31/2013   Your procedure is scheduled on: Wednesday, February 01, 2013  Report to [facility] at 11:00AM.  Call this number if you have problems the morning of surgery: [phone]   Remember:   Do not eat food or drink liquids after midnight.   Take these medicines the morning of surgery with A SIP OF WATER: if needed: oxyCODONE-acetaminophen  (PERCOCET/ROXICET) 5-325 MG per tablet for pain,  ondansetron (ZOFRAN ODT) 8 MG disintegrating tablet  for nausea Stop taking Aspirin and herbal medications. Do not take any NSAIDs ie: Ibuprofen, Advil, Naproxen or any medication containing Aspirin.   Do not wear jewelry, make-up or nail polish.  Do not wear lotions, powders, or perfumes. You may wear deodorant.  Do not shave 48 hours prior to surgery. Men may shave face and neck.  Do not bring valuables to the hospital.  [facility] is not responsible  for any belongings or valuables.  Contacts, dentures or bridgework may not be worn into surgery.  Leave suitcase in the car. After surgery it may be brought to your room.  For patients admitted to the hospital, checkout time is 11:00 AM the day of discharge.   Patients discharged the day of surgery will not be allowed to drive home.  Name and phone number of your driver:  Special Instructions: Shower using CHG 2 nights before surgery and the night before surgery.  If you shower the day of surgery use CHG.  Use special wash - you have one bottle of CHG for all showers.  You should use approximately 1/3 of the bottle for each shower.   Please read over the following fact sheets that you were given: Pain Booklet, Coughing and Deep Breathing and Surgical Site Infection Prevention

## 2013-02-01 ENCOUNTER — Encounter (HOSPITAL_COMMUNITY)
Admission: RE | Disposition: A | Discharge status: Home / Self Care | Payer: Self-pay | Source: Ambulatory Visit | Attending: Surgery

## 2013-02-01 ENCOUNTER — Encounter (HOSPITAL_COMMUNITY): Payer: Self-pay | Admitting: Anesthesiology

## 2013-02-01 ENCOUNTER — Ambulatory Visit (HOSPITAL_COMMUNITY): Payer: PRIVATE HEALTH INSURANCE

## 2013-02-01 ENCOUNTER — Ambulatory Visit (HOSPITAL_COMMUNITY): Payer: PRIVATE HEALTH INSURANCE | Admitting: Anesthesiology

## 2013-02-01 ENCOUNTER — Encounter (HOSPITAL_COMMUNITY): Payer: Self-pay | Admitting: *Deleted

## 2013-02-01 ENCOUNTER — Ambulatory Visit (HOSPITAL_COMMUNITY)
Admission: RE | Admit: 2013-02-01 | Discharge: 2013-02-01 | Disposition: A | Discharge status: Home / Self Care | Payer: PRIVATE HEALTH INSURANCE | Source: Ambulatory Visit | Attending: Surgery | Admitting: Surgery

## 2013-02-01 DIAGNOSIS — K660 Peritoneal adhesions (postprocedural) (postinfection): Secondary | ICD-10-CM

## 2013-02-01 DIAGNOSIS — M129 Arthropathy, unspecified: Secondary | ICD-10-CM | POA: Insufficient documentation

## 2013-02-01 DIAGNOSIS — K828 Other specified diseases of gallbladder: Secondary | ICD-10-CM | POA: Insufficient documentation

## 2013-02-01 DIAGNOSIS — K8010 Calculus of gallbladder with chronic cholecystitis without obstruction: Secondary | ICD-10-CM

## 2013-02-01 DIAGNOSIS — F17200 Nicotine dependence, unspecified, uncomplicated: Secondary | ICD-10-CM | POA: Insufficient documentation

## 2013-02-01 HISTORY — PX: LAPAROSCOPIC LYSIS OF ADHESIONS: SHX5905

## 2013-02-01 HISTORY — PX: CHOLECYSTECTOMY: SHX55

## 2013-02-01 SURGERY — LAPAROSCOPIC CHOLECYSTECTOMY WITH INTRAOPERATIVE CHOLANGIOGRAM
Anesthesia: General | Site: Abdomen | Wound class: Clean Contaminated

## 2013-02-01 MED ORDER — LIDOCAINE HCL (CARDIAC) 20 MG/ML IV SOLN
INTRAVENOUS | Status: DC | PRN
  Administered 2013-02-01: 65 mg via INTRAVENOUS

## 2013-02-01 MED ORDER — SODIUM CHLORIDE 0.9 % IV SOLN
INTRAVENOUS | Status: DC | PRN
  Administered 2013-02-01: 15:00:00

## 2013-02-01 MED ORDER — ROCURONIUM BROMIDE 100 MG/10ML IV SOLN
INTRAVENOUS | Status: DC | PRN
  Administered 2013-02-01: 50 mg via INTRAVENOUS
  Administered 2013-02-01 (×3): 10 mg via INTRAVENOUS

## 2013-02-01 MED ORDER — OXYCODONE HCL 5 MG/5ML PO SOLN
ORAL | Status: AC
  Administered 2013-02-01: 5 mg via ORAL
  Filled 2013-02-01: qty 5

## 2013-02-01 MED ORDER — HYDROMORPHONE HCL PF 1 MG/ML IJ SOLN
INTRAMUSCULAR | Status: AC
  Administered 2013-02-01: 0.5 mg via INTRAVENOUS
  Filled 2013-02-01: qty 1

## 2013-02-01 MED ORDER — ONDANSETRON HCL 4 MG/2ML IJ SOLN: 4.0000 mg | Freq: Four times a day (QID) | INTRAMUSCULAR | Status: DC | PRN

## 2013-02-01 MED ORDER — NEOSTIGMINE METHYLSULFATE 1 MG/ML IJ SOLN
INTRAMUSCULAR | Status: DC | PRN
  Administered 2013-02-01: 4 mg via INTRAVENOUS

## 2013-02-01 MED ORDER — SODIUM CHLORIDE 0.9 % IR SOLN
Status: DC | PRN
  Administered 2013-02-01: 1000 mL

## 2013-02-01 MED ORDER — LACTATED RINGERS IV SOLN
INTRAVENOUS | Status: DC
  Administered 2013-02-01: 12:00:00 via INTRAVENOUS

## 2013-02-01 MED ORDER — HYDROMORPHONE HCL PF 1 MG/ML IJ SOLN: 0.2500 mg | INTRAMUSCULAR | Status: DC | PRN

## 2013-02-01 MED ORDER — OXYCODONE-ACETAMINOPHEN 5-325 MG PO TABS: 1.0000 | ORAL_TABLET | ORAL | Status: DC | PRN

## 2013-02-01 MED ORDER — OXYCODONE HCL 5 MG PO TABS
5.0000 mg | ORAL_TABLET | Freq: Once | ORAL | Status: AC | PRN
Start: 2013-02-01 — End: 2013-02-01

## 2013-02-01 MED ORDER — BUPIVACAINE-EPINEPHRINE PF 0.25-1:200000 % IJ SOLN
INTRAMUSCULAR | Status: AC
  Filled 2013-02-01: qty 30

## 2013-02-01 MED ORDER — BUPIVACAINE-EPINEPHRINE 0.25% -1:200000 IJ SOLN
INTRAMUSCULAR | Status: DC | PRN
  Administered 2013-02-01: 15 mL

## 2013-02-01 MED ORDER — PROPOFOL 10 MG/ML IV BOLUS
INTRAVENOUS | Status: DC | PRN
  Administered 2013-02-01 (×3): 20 mg via INTRAVENOUS
  Administered 2013-02-01: 170 mg via INTRAVENOUS

## 2013-02-01 MED ORDER — ARTIFICIAL TEARS OP OINT
TOPICAL_OINTMENT | OPHTHALMIC | Status: DC | PRN
  Administered 2013-02-01: 1 via OPHTHALMIC

## 2013-02-01 MED ORDER — MIDAZOLAM HCL 5 MG/5ML IJ SOLN
INTRAMUSCULAR | Status: DC | PRN
  Administered 2013-02-01: 2 mg via INTRAVENOUS

## 2013-02-01 MED ORDER — LACTATED RINGERS IV SOLN
INTRAVENOUS | Status: DC | PRN
  Administered 2013-02-01 (×2): via INTRAVENOUS

## 2013-02-01 MED ORDER — CEFAZOLIN SODIUM 1-5 GM-% IV SOLN
INTRAVENOUS | Status: AC
  Filled 2013-02-01: qty 100

## 2013-02-01 MED ORDER — HYDROMORPHONE HCL PF 1 MG/ML IJ SOLN: 1.0000 mg | INTRAMUSCULAR | Status: DC | PRN

## 2013-02-01 MED ORDER — CHLORHEXIDINE GLUCONATE 4 % EX LIQD: 1.0000 "application " | Freq: Once | CUTANEOUS | Status: DC

## 2013-02-01 MED ORDER — GLYCOPYRROLATE 0.2 MG/ML IJ SOLN
INTRAMUSCULAR | Status: DC | PRN
  Administered 2013-02-01: 0.6 mg via INTRAVENOUS

## 2013-02-01 MED ORDER — FENTANYL CITRATE 0.05 MG/ML IJ SOLN
INTRAMUSCULAR | Status: DC | PRN
  Administered 2013-02-01: 50 ug via INTRAVENOUS
  Administered 2013-02-01 (×2): 100 ug via INTRAVENOUS
  Administered 2013-02-01 (×3): 50 ug via INTRAVENOUS

## 2013-02-01 MED ORDER — ONDANSETRON HCL 4 MG/2ML IJ SOLN: 4.0000 mg | INTRAMUSCULAR | Status: DC | PRN

## 2013-02-01 MED ORDER — DEXAMETHASONE SODIUM PHOSPHATE 10 MG/ML IJ SOLN
INTRAMUSCULAR | Status: DC | PRN
  Administered 2013-02-01: 8 mg via INTRAVENOUS

## 2013-02-01 MED ORDER — OXYCODONE HCL 5 MG/5ML PO SOLN: 5.0000 mg | Freq: Once | ORAL | Status: AC | PRN

## 2013-02-01 SURGICAL SUPPLY — 44 items
APPLICATOR COTTON TIP 6IN STRL (MISCELLANEOUS) ×2 IMPLANT
APPLIER CLIP ROT 10 11.4 M/L (STAPLE) ×4
BENZOIN TINCTURE PRP APPL 2/3 (GAUZE/BANDAGES/DRESSINGS) ×2 IMPLANT
BLADE SURG ROTATE 9660 (MISCELLANEOUS) IMPLANT
CANISTER SUCTION 2500CC (MISCELLANEOUS) ×2 IMPLANT
CHLORAPREP W/TINT 26ML (MISCELLANEOUS) ×2 IMPLANT
CLIP APPLIE ROT 10 11.4 M/L (STAPLE) ×2 IMPLANT
CLOTH BEACON ORANGE TIMEOUT ST (SAFETY) ×2 IMPLANT
COVER MAYO STAND STRL (DRAPES) ×2 IMPLANT
COVER SURGICAL LIGHT HANDLE (MISCELLANEOUS) ×2 IMPLANT
DECANTER SPIKE VIAL GLASS SM (MISCELLANEOUS) ×4 IMPLANT
DRAPE C-ARM 42X72 X-RAY (DRAPES) ×2 IMPLANT
DRAPE UTILITY 15X26 W/TAPE STR (DRAPE) ×4 IMPLANT
DRSG TEGADERM 4X4.75 (GAUZE/BANDAGES/DRESSINGS) ×2 IMPLANT
ELECT REM PT RETURN 9FT ADLT (ELECTROSURGICAL) ×2
ELECTRODE REM PT RTRN 9FT ADLT (ELECTROSURGICAL) ×1 IMPLANT
FILTER SMOKE EVAC LAPAROSHD (FILTER) ×2 IMPLANT
GAUZE SPONGE 2X2 8PLY STRL LF (GAUZE/BANDAGES/DRESSINGS) ×1 IMPLANT
GLOVE BIO SURGEON STRL SZ 6.5 (GLOVE) ×2 IMPLANT
GLOVE BIO SURGEON STRL SZ7 (GLOVE) ×4 IMPLANT
GLOVE BIO SURGEON STRL SZ7.5 (GLOVE) ×6 IMPLANT
GLOVE BIOGEL PI IND STRL 7.5 (GLOVE) ×2 IMPLANT
GLOVE BIOGEL PI INDICATOR 7.5 (GLOVE) ×2
GLOVE SS BIOGEL STRL SZ 6.5 (GLOVE) ×1 IMPLANT
GLOVE SUPERSENSE BIOGEL SZ 6.5 (GLOVE) ×1
GOWN STRL NON-REIN LRG LVL3 (GOWN DISPOSABLE) ×12 IMPLANT
KIT BASIN OR (CUSTOM PROCEDURE TRAY) ×2 IMPLANT
KIT ROOM TURNOVER OR (KITS) ×2 IMPLANT
NS IRRIG 1000ML POUR BTL (IV SOLUTION) ×2 IMPLANT
PAD ARMBOARD 7.5X6 YLW CONV (MISCELLANEOUS) ×2 IMPLANT
POUCH SPECIMEN RETRIEVAL 10MM (ENDOMECHANICALS) ×2 IMPLANT
SCISSORS LAP 5X35 DISP (ENDOMECHANICALS) ×2 IMPLANT
SET CHOLANGIOGRAPH 5 50 .035 (SET/KITS/TRAYS/PACK) ×2 IMPLANT
SET IRRIG TUBING LAPAROSCOPIC (IRRIGATION / IRRIGATOR) ×2 IMPLANT
SLEEVE ENDOPATH XCEL 5M (ENDOMECHANICALS) ×4 IMPLANT
SPECIMEN JAR SMALL (MISCELLANEOUS) ×2 IMPLANT
SPONGE GAUZE 2X2 STER 10/PKG (GAUZE/BANDAGES/DRESSINGS) ×1
SUT MNCRL AB 4-0 PS2 18 (SUTURE) ×2 IMPLANT
TOWEL OR 17X24 6PK STRL BLUE (TOWEL DISPOSABLE) ×2 IMPLANT
TOWEL OR 17X26 10 PK STRL BLUE (TOWEL DISPOSABLE) ×2 IMPLANT
TRAY LAPAROSCOPIC (CUSTOM PROCEDURE TRAY) ×2 IMPLANT
TROCAR XCEL BLUNT TIP 100MML (ENDOMECHANICALS) ×2 IMPLANT
TROCAR XCEL NON-BLD 11X100MML (ENDOMECHANICALS) ×2 IMPLANT
TROCAR XCEL NON-BLD 5MMX100MML (ENDOMECHANICALS) ×2 IMPLANT

## 2013-02-01 NOTE — Interval H&P Note (Signed)
History and Physical Interval Note:  02/01/2013 10:56 AM  Crystal Fuentes  has presented today for surgery, with the diagnosis of chronic calculus cholecystitis  The various methods of treatment have been discussed with the patient and family. After consideration of risks, benefits and other options for treatment, the patient has consented to  Procedure(s): LAPAROSCOPIC CHOLECYSTECTOMY WITH INTRAOPERATIVE CHOLANGIOGRAM (N/A) as a surgical intervention .  The patient's history has been reviewed, patient examined, no change in status, stable for surgery.  I have reviewed the patient's chart and labs.  Questions were answered to the patient's satisfaction.     Rigley Niess K.

## 2013-02-01 NOTE — Anesthesia Preprocedure Evaluation (Signed)
Anesthesia Evaluation  Patient identified by MRN, date of birth, ID band Patient awake    Reviewed: Allergy & Precautions, H&P , NPO status , Patient's Chart, lab work & pertinent test results  Airway Mallampati: II  Neck ROM: full    Dental   Pulmonary Current Smoker,          Cardiovascular     Neuro/Psych  Headaches,    GI/Hepatic GERD-  ,  Endo/Other  obese  Renal/GU      Musculoskeletal  (+) Arthritis -,   Abdominal   Peds  Hematology   Anesthesia Other Findings   Reproductive/Obstetrics                           Anesthesia Physical Anesthesia Plan  ASA: II  Anesthesia Plan: General   Post-op Pain Management:    Induction: Intravenous  Airway Management Planned: Oral ETT  Additional Equipment:   Intra-op Plan:   Post-operative Plan: Extubation in OR  Informed Consent: I have reviewed the patients History and Physical, chart, labs and discussed the procedure including the risks, benefits and alternatives for the proposed anesthesia with the patient or authorized representative who has indicated his/her understanding and acceptance.     Plan Discussed with: CRNA, Anesthesiologist and Surgeon  Anesthesia Plan Comments:         Anesthesia Quick Evaluation

## 2013-02-01 NOTE — Op Note (Signed)
Laparoscopic lysis of adhesions (90 minutes), laparoscopic Cholecystectomy with IOC Procedure Note  Indications: This patient presents with symptomatic gallbladder disease and will undergo laparoscopic cholecystectomy.  Pre-operative Diagnosis: Calculus of gallbladder with other cholecystitis, without mention of obstruction/ extensive intra-abdominal adhesions  Post-operative Diagnosis: Same  Surgeon: Collette Pescador K.   Assistants: none  Anesthesia: General endotracheal anesthesia  ASA Class: 2  Procedure Details  The patient was seen again in the Holding Room. The risks, benefits, complications, treatment options, and expected outcomes were discussed with the patient. The possibilities of reaction to medication, pulmonary aspiration, perforation of viscus, bleeding, recurrent infection, finding a normal gallbladder, the need for additional procedures, failure to diagnose a condition, the possible need to convert to an open procedure, and creating a complication requiring transfusion or operation were discussed with the patient. The likelihood of improving the patient's symptoms with return to their baseline status is good.  The patient and/or family concurred with the proposed plan, giving informed consent. The site of surgery properly noted. The patient was taken to Operating Room, identified as Crystal Fuentes and the procedure verified as Laparoscopic Cholecystectomy with Intraoperative Cholangiogram. A Time Out was held and the above information confirmed.  Prior to the induction of general anesthesia, antibiotic prophylaxis was administered. General endotracheal anesthesia was then administered and tolerated well. After the induction, the abdomen was prepped with Chloraprep and draped in the sterile fashion. The patient was positioned in the supine position.  The patient has a previous upper midline incision.  We used a 5 mm Optiview trocar to cannulate the peritoneal cavity.  We insufflated  CO2, which began to insufflate the abdomen.  However, when we inserted the laparoscope, it appeared that there were significant adhesions in the LUQ.  Local anesthetic agent was injected into the skin below the umbilicus and an incision made. We dissected down to the abdominal fascia with blunt dissection.  The fascia was incised vertically and we entered the peritoneal cavity bluntly.  A pursestring suture of 0-Vicryl was placed around the fascial opening.  The Hasson cannula was inserted and secured with the stay suture.  Pneumoperitoneum was then created with CO2 and tolerated well without any adverse changes in the patient's vital signs.  The scope was inserted and there were an immense amount of adhesions in the upper abdomen.  A 5 mm port was placed laterally on the left side, and we began carefully dissecting the adhesions away from the abdominal wall.  We were finally able to clear the RUQ and visualized the gallbladder and liver.  Two 5-mm ports were placed in the right upper quadrant. The initial 5 mm optiview port was converted to an 11 mm port.  All skin incisions were infiltrated with a local anesthetic agent before making the incisions and placing the trocars.   We positioned the patient in reverse Trendelenburg, tilted slightly to the patient's left.  The gallbladder was identified, the fundus grasped and retracted cephalad. Adhesions were lysed bluntly and with the electrocautery where indicated, taking care not to injure any adjacent organs or viscus. The infundibulum was grasped and retracted laterally, exposing the peritoneum overlying the triangle of Calot. This was then divided and exposed in a blunt fashion. A critical view of the cystic duct and cystic artery was obtained.  The cystic duct was clearly identified and bluntly dissected circumferentially. The cystic duct was ligated with a clip distally.   An incision was made in the cystic duct and the [facility] cholangiogram catheter introduced.  The catheter was secured using a clip. A cholangiogram was then obtained which showed good visualization of the distal and proximal biliary tree with no sign of filling defects or obstruction.  Contrast flowed easily into the duodenum. The catheter was then removed.   The cystic duct was then ligated with clips and divided. The cystic artery was identified, dissected free, ligated with clips and divided as well.   The gallbladder was dissected from the liver bed in retrograde fashion with the electrocautery. The gallbladder was removed and placed in an Espinar retrieval sac. The liver bed was irrigated and inspected. Hemostasis was achieved with the electrocautery. Copious irrigation was utilized and was repeatedly aspirated until clear.  The gallbladder and retrieval sac were then removed through the umbilical port site.  The pursestring suture was used to close the umbilical fascia.    We again inspected the right upper quadrant for hemostasis.  Pneumoperitoneum was released as we removed the trocars.  4-0 Monocryl was used to close the skin.   Benzoin, steri-strips, and clean dressings were applied. The patient was then extubated and brought to the recovery room in stable condition. Instrument, sponge, and needle counts were correct at closure and at the conclusion of the case.   Findings: Cholecystitis with Cholelithiasis  Estimated Blood Loss: less than 50 mL         Drains: none         Specimens: Gallbladder           Complications: None; patient tolerated the procedure well.         Disposition: PACU - hemodynamically stable.         Condition: stable  Crystal Fuentes. Crystal Skains, MD, [facility]  General/ Trauma Surgery  02/01/2013 3:39 PM

## 2013-02-01 NOTE — H&P (View-Only) (Signed)
Patient ID: Crystal Fuentes, female   DOB: 12/19/1970, 42 y.o.   MRN: [id number]  Chief Complaint  Patient presents with  . New Evaluation    eval chole    HPI Crystal Fuentes is a 42 y.o. female.  Referred by Dr. Kevin Fuentes - [place] for gallbladder disease HPI This is a 42-year-old female who presents with a two-week history of severe epigastric pain with radiation around her right side to her back. This was associated with nausea and abdominal bloating. She denies any diarrhea. She actually has been constipated. She was evaluated at [facility] was found to have a gallstone but no sign cholecystitis. She continues to have some abdominal pain that is exacerbated by eating.  Past Medical History  Diagnosis Date  . Arthritis     Past Surgical History  Procedure Laterality Date  . Cesarean section    . Colonoscopy  [id number]    Family History  Problem Relation Age of Onset  . Heart disease Mother   . Hypertension Mother   . Hyperlipidemia Mother   . Arthritis Mother   . Asthma Mother   . Cancer Father   . Hypertension Father   . Hyperlipidemia Father   . Hypertension Sister   . Hyperlipidemia Sister   . Hypertension Brother   . Hyperlipidemia Brother     Social History History  Substance Use Topics  . Smoking status: Current Some Day Smoker -- 1.50 packs/day    Types: Cigarettes  . Smokeless tobacco: Never Used  . Alcohol Use: No    No Known Allergies  Current Outpatient Prescriptions  Medication Sig Dispense Refill  . ondansetron (ZOFRAN ODT) 8 MG disintegrating tablet Take 1 tablet (8 mg total) by mouth every 8 (eight) hours as needed for nausea.  12 tablet  0  . oxyCODONE-acetaminophen (PERCOCET/ROXICET) 5-325 MG per tablet Take 1 tablet by mouth every 4 (four) hours as needed for pain.  20 tablet  0   No current facility-administered medications for this visit.    Review of Systems Review of Systems  Constitutional: Negative for fever, chills and unexpected weight  change.  HENT: Negative for hearing loss, congestion, sore throat, trouble swallowing and voice change.   Eyes: Negative for visual disturbance.  Respiratory: Negative for cough and wheezing.   Cardiovascular: Negative for chest pain, palpitations and leg swelling.  Gastrointestinal: Positive for nausea, abdominal pain, constipation and abdominal distention. Negative for vomiting, diarrhea, blood in stool and anal bleeding.  Genitourinary: Negative for hematuria, vaginal bleeding and difficulty urinating.  Musculoskeletal: Negative for arthralgias.  Skin: Negative for rash and wound.  Neurological: Negative for seizures, syncope and headaches.  Hematological: Negative for adenopathy. Does not bruise/bleed easily.  Psychiatric/Behavioral: Negative for confusion.    Blood pressure 118/70, pulse 72, temperature 99 F (37.2 C), temperature source Temporal, resp. rate 16, height 5' 4" (1.626 m), weight 193 lb (87.544 kg), last menstrual period 01/10/2013.  Physical Exam Physical Exam WDWN in NAD HEENT:  EOMI, sclera anicteric Neck:  No masses, no thyromegaly Lungs:  CTA bilaterally; normal respiratory effort CV:  Regular rate and rhythm; no murmurs Abd:  +bowel sounds, soft, mild epigastric and RUQ tenderness Ext:  Well-perfused; no edema Skin:  Warm, dry; no sign of jaundice  Data Reviewed Lab Results  Component Value Date   WBC 8.9 01/10/2013   HGB 13.4 01/10/2013   HCT 39.1 01/10/2013   MCV 93.5 01/10/2013   PLT 192 01/10/2013   Lab Results  Component Value   Date   ALT 11 01/10/2013   AST 14 01/10/2013   ALKPHOS 68 01/10/2013   BILITOT 0.1* 01/10/2013   Lab Results  Component Value Date   CREATININE 0.67 01/10/2013   BUN 12 01/10/2013   NA 139 01/10/2013   K 4.4 01/10/2013   CL 104 01/10/2013   CO2 28 01/10/2013   *RADIOLOGY REPORT*  Clinical Data: Abdominal pain  ABDOMINAL ULTRASOUND COMPLETE  Comparison: None.  Findings:  Gallbladder: 08/03 0.2 cm gallstone is present  towards the neck of  the gallbladder. No wall thickening or pericholecystic fluid. No  Murphy's sign.  Common Bile Duct: Within normal limits in caliber.  Liver: No focal mass lesion identified. Within normal limits in  parenchymal echogenicity.  IVC: Appears normal.  Pancreas: The pancreas was completely obscured by overlying bowel  gas.  Spleen: Within normal limits in size and echotexture.  Right kidney: Normal in size and parenchymal echogenicity. No  evidence of mass or hydronephrosis.  Left kidney: Normal in size and parenchymal echogenicity. No  evidence of mass or hydronephrosis.  Abdominal Aorta: Portions of the aorta were obscured by overlying  bowel gas. No obvious aneurysm.  IMPRESSION:  Cholelithiasis.  Pancreas and portions of the aorta were obscured.  Original Report Authenticated By: Arthur Hoss, M.D.      IMP:  Chronic calculus cholecystitis     Plan    Laparoscopic cholecystectomy with intraoperative cholangiogram. The surgical procedure has been discussed with the patient.  Potential risks, benefits, alternative treatments, and expected outcomes have been explained.  All of the patient's questions at this time have been answered.  The likelihood of reaching the patient's treatment goal is good.  The patient understand the proposed surgical procedure and wishes to proceed.         Gearlene Godsil K. 01/23/2013, 2:14 PM    

## 2013-02-01 NOTE — Transfer of Care (Signed)
Immediate Anesthesia Transfer of Care Note  Patient: Crystal Fuentes  Procedure(s) Performed: Procedure(s): LAPAROSCOPIC CHOLECYSTECTOMY WITH INTRAOPERATIVE CHOLANGIOGRAM (N/A) LAPAROSCOPIC LYSIS OF ADHESIONS (N/A)  Patient Location: PACU  Anesthesia Type:General  Level of Consciousness: awake and alert   Airway & Oxygen Therapy: Patient Spontanous Breathing and Patient connected to nasal cannula oxygen  Post-op Assessment: Report given to PACU RN, Post -op Vital signs reviewed and stable and Patient moving all extremities X 4  Post vital signs: Reviewed and stable  Complications: No apparent anesthesia complications

## 2013-02-01 NOTE — Preoperative (Signed)
Beta Blockers   Reason not to administer Beta Blockers:Not Applicable 

## 2013-02-01 NOTE — Anesthesia Postprocedure Evaluation (Signed)
Anesthesia Post Note  Patient: Crystal Fuentes  Procedure(s) Performed: Procedure(s) (LRB): LAPAROSCOPIC CHOLECYSTECTOMY WITH INTRAOPERATIVE CHOLANGIOGRAM (N/A) LAPAROSCOPIC LYSIS OF ADHESIONS (N/A)  Anesthesia type: general  Patient location: PACU  Post pain: Pain level controlled  Post assessment: Patient's Cardiovascular Status Stable  Last Vitals:  Filed Vitals:   02/01/13 1730  BP: 120/67  Pulse: 63  Temp:   Resp: 18    Post vital signs: Reviewed and stable  Level of consciousness: sedated  Complications: No apparent anesthesia complications

## 2013-02-04 DIAGNOSIS — K5900 Constipation, unspecified: Secondary | ICD-10-CM | POA: Insufficient documentation

## 2013-02-04 DIAGNOSIS — R609 Edema, unspecified: Secondary | ICD-10-CM | POA: Insufficient documentation

## 2013-02-05 ENCOUNTER — Emergency Department (HOSPITAL_COMMUNITY)
Admission: EM | Admit: 2013-02-05 | Discharge: 2013-02-05 | Discharge status: Against Medical Advice | Payer: PRIVATE HEALTH INSURANCE | Attending: Emergency Medicine | Admitting: Emergency Medicine

## 2013-02-05 ENCOUNTER — Encounter (HOSPITAL_COMMUNITY): Payer: Self-pay | Admitting: *Deleted

## 2013-02-05 LAB — CBC WITH DIFFERENTIAL/PLATELET
Eosinophils Relative: 6 % — ABNORMAL HIGH (ref 0–5)
HCT: 37.2 % (ref 36.0–46.0)
Lymphocytes Relative: 26 % (ref 12–46)
Lymphs Abs: 2.3 10*3/uL (ref 0.7–4.0)
MCV: 95.1 fL (ref 78.0–100.0)
Monocytes Absolute: 0.6 10*3/uL (ref 0.1–1.0)
Monocytes Relative: 7 % (ref 3–12)
RBC: 3.91 MIL/uL (ref 3.87–5.11)
WBC: 8.8 10*3/uL (ref 4.0–10.5)

## 2013-02-05 LAB — URINALYSIS, ROUTINE W REFLEX MICROSCOPIC
Bilirubin Urine: NEGATIVE
Ketones, ur: NEGATIVE mg/dL
Specific Gravity, Urine: 1.006 (ref 1.005–1.030)
Urobilinogen, UA: 0.2 mg/dL (ref 0.0–1.0)
pH: 6 (ref 5.0–8.0)

## 2013-02-05 LAB — COMPREHENSIVE METABOLIC PANEL
ALT: 19 U/L (ref 0–35)
BUN: 11 mg/dL (ref 6–23)
CO2: 29 mEq/L (ref 19–32)
Calcium: 9.4 mg/dL (ref 8.4–10.5)
Creatinine, Ser: 0.65 mg/dL (ref 0.50–1.10)
GFR calc Af Amer: >90 mL/min (ref >90)
GFR calc non Af Amer: >90 mL/min (ref >90)
Glucose, Bld: 96 mg/dL (ref 70–99)

## 2013-02-05 LAB — URINE MICROSCOPIC-ADD ON

## 2013-02-05 NOTE — ED Notes (Signed)
Pt did not respond to call from waiting room x 2 

## 2013-02-05 NOTE — ED Notes (Signed)
The pt has not had a bm  For 3-4 days she is also c/o lower extremity swelling.  gb removed  Wednesday.  Nauseated..  She is also c/o vaginal bleeding  lmp 2 months ago

## 2013-02-05 NOTE — ED Notes (Addendum)
Pt did not respond to call from waiting room x 1  

## 2013-02-06 ENCOUNTER — Encounter (HOSPITAL_COMMUNITY): Payer: Self-pay | Admitting: Surgery

## 2013-02-08 ENCOUNTER — Ambulatory Visit: Admit: 2013-02-08 | Payer: PRIVATE HEALTH INSURANCE | Admitting: Surgery

## 2013-02-08 SURGERY — LAPAROSCOPIC CHOLECYSTECTOMY WITH INTRAOPERATIVE CHOLANGIOGRAM
Anesthesia: General

## 2013-02-14 ENCOUNTER — Encounter (INDEPENDENT_AMBULATORY_CARE_PROVIDER_SITE_OTHER): Payer: Self-pay | Admitting: Surgery

## 2013-02-14 ENCOUNTER — Telehealth (INDEPENDENT_AMBULATORY_CARE_PROVIDER_SITE_OTHER): Payer: Self-pay

## 2013-02-14 ENCOUNTER — Telehealth (INDEPENDENT_AMBULATORY_CARE_PROVIDER_SITE_OTHER): Payer: Self-pay | Admitting: General Surgery

## 2013-02-14 NOTE — Telephone Encounter (Signed)
Called patient back to let her know that all the sign that she is having from GB surgery is normal. I told her to put ice and heat to her right side and her belly button where the Gallbladder was removed. And I also told her to do more water and take more of the MOM and she will need to move around more and I spoke to Dr Corliss Skains and he stated that I can call in some Vicodin into the [facility] at [facility] and I told the patient to keep her apt with Dr Corliss Skains on 02-17-13

## 2013-02-14 NOTE — Telephone Encounter (Signed)
Patient calling into office to report she's still having discomfort at her umbilicus.  Advised patient that pain at her umbilicus is typical.  Patient denies having any redness or drainage.  Patient also states that when she takes deep breaths that she's having pain on her right side.  Patient denies having any nausea/vomiting or fevers.  Patient has not had a bowel movement.  Patient states she's taking Milk of Magnesia as instructed per DC instructions.  Patient advised to drink plenty of fluids.  Patient advised to try and resume daily activities such as walking but, she states moving around causes more pain.  Patient requesting a refill on her pain medication.  She states she's taking half of her tablet to make them last longer but, she's almost out of pain medication.  Patient has appointment on 02/17/13 @ 9:10 am w/Dr. Corliss Skains.  Offered patient an earlier appointment to be evaluated but, patient wanted me to send message to Dr. Corliss Skains for advice.

## 2013-02-17 ENCOUNTER — Ambulatory Visit (INDEPENDENT_AMBULATORY_CARE_PROVIDER_SITE_OTHER): Payer: PRIVATE HEALTH INSURANCE | Admitting: Surgery

## 2013-02-17 ENCOUNTER — Encounter (INDEPENDENT_AMBULATORY_CARE_PROVIDER_SITE_OTHER): Payer: Self-pay | Admitting: Surgery

## 2013-02-17 VITALS — BP 118/72 | HR 84 | Temp 99.1°F | Resp 16 | Ht 64.0 in | Wt 192.4 lb

## 2013-02-17 DIAGNOSIS — K8010 Calculus of gallbladder with chronic cholecystitis without obstruction: Secondary | ICD-10-CM

## 2013-02-17 NOTE — Progress Notes (Signed)
Status post laparoscopic cholecystectomy on 02/01/13 for chronic cholecystitis. The patient is doing much better. Her pain is almost completely gone. Her incisions are well-healed with no sign of infection. Appetite and bowel movements are normal. She denies any problems with diarrhea. She has lobar soreness at her right costal margin where we inserted the cholangiogram catheter. I assured her that this would resolve over time.  She may resume full activity and may return to full duty at work. Followup when necessary.  Wilmon Arms. Corliss Skains, MD, [facility]  General/ Trauma Surgery  02/17/2013 12:58 PM

## 2013-02-20 ENCOUNTER — Encounter (INDEPENDENT_AMBULATORY_CARE_PROVIDER_SITE_OTHER): Payer: Self-pay

## 2013-04-04 ENCOUNTER — Emergency Department (HOSPITAL_COMMUNITY)
Admission: EM | Admit: 2013-04-04 | Discharge: 2013-04-04 | Disposition: A | Discharge status: Home / Self Care | Payer: PRIVATE HEALTH INSURANCE | Attending: Emergency Medicine | Admitting: Emergency Medicine

## 2013-04-04 ENCOUNTER — Encounter (HOSPITAL_COMMUNITY): Payer: Self-pay | Admitting: Emergency Medicine

## 2013-04-04 DIAGNOSIS — R110 Nausea: Secondary | ICD-10-CM | POA: Insufficient documentation

## 2013-04-04 DIAGNOSIS — Z8701 Personal history of pneumonia (recurrent): Secondary | ICD-10-CM | POA: Insufficient documentation

## 2013-04-04 DIAGNOSIS — K219 Gastro-esophageal reflux disease without esophagitis: Secondary | ICD-10-CM | POA: Insufficient documentation

## 2013-04-04 DIAGNOSIS — F17200 Nicotine dependence, unspecified, uncomplicated: Secondary | ICD-10-CM | POA: Insufficient documentation

## 2013-04-04 DIAGNOSIS — R109 Unspecified abdominal pain: Secondary | ICD-10-CM

## 2013-04-04 DIAGNOSIS — R197 Diarrhea, unspecified: Secondary | ICD-10-CM | POA: Insufficient documentation

## 2013-04-04 DIAGNOSIS — M129 Arthropathy, unspecified: Secondary | ICD-10-CM | POA: Insufficient documentation

## 2013-04-04 DIAGNOSIS — Z3202 Encounter for pregnancy test, result negative: Secondary | ICD-10-CM | POA: Insufficient documentation

## 2013-04-04 DIAGNOSIS — Z8669 Personal history of other diseases of the nervous system and sense organs: Secondary | ICD-10-CM | POA: Insufficient documentation

## 2013-04-04 LAB — CBC WITH DIFFERENTIAL/PLATELET
Basophils Absolute: 0 10*3/uL (ref 0.0–0.1)
Basophils Relative: 0 % (ref 0–1)
Eosinophils Absolute: 0.3 10*3/uL (ref 0.0–0.7)
Hemoglobin: 13.1 g/dL (ref 12.0–15.0)
MCH: 32.8 pg (ref 26.0–34.0)
MCHC: 35 g/dL (ref 30.0–36.0)
Monocytes Relative: 5 % (ref 3–12)
Neutro Abs: 4.7 10*3/uL (ref 1.7–7.7)
Neutrophils Relative %: 61 % (ref 43–77)
Platelets: 174 10*3/uL (ref 150–400)
RDW: 13.3 % (ref 11.5–15.5)

## 2013-04-04 LAB — COMPREHENSIVE METABOLIC PANEL
ALT: 9 U/L (ref 0–35)
CO2: 25 mEq/L (ref 19–32)
Calcium: 8.9 mg/dL (ref 8.4–10.5)
Chloride: 101 mEq/L (ref 96–112)
GFR calc Af Amer: >90 mL/min (ref >90)
GFR calc non Af Amer: >90 mL/min (ref >90)
Glucose, Bld: 88 mg/dL (ref 70–99)
Sodium: 135 mEq/L (ref 135–145)
Total Bilirubin: 0.2 mg/dL — ABNORMAL LOW (ref 0.3–1.2)

## 2013-04-04 LAB — URINALYSIS, ROUTINE W REFLEX MICROSCOPIC
Ketones, ur: NEGATIVE mg/dL
Leukocytes, UA: NEGATIVE
Protein, ur: NEGATIVE mg/dL
Urobilinogen, UA: 1 mg/dL (ref 0.0–1.0)

## 2013-04-04 LAB — LIPASE, BLOOD: Lipase: 66 U/L — ABNORMAL HIGH (ref 11–59)

## 2013-04-04 LAB — POCT PREGNANCY, URINE: Preg Test, Ur: NEGATIVE

## 2013-04-04 MED ORDER — ONDANSETRON HCL 4 MG/2ML IJ SOLN
4.0000 mg | Freq: Once | INTRAMUSCULAR | Status: AC
  Administered 2013-04-04: 4 mg via INTRAVENOUS
  Filled 2013-04-04: qty 2

## 2013-04-04 MED ORDER — MORPHINE SULFATE 4 MG/ML IJ SOLN: 4.0000 mg | Freq: Once | INTRAMUSCULAR | Status: DC

## 2013-04-04 MED ORDER — OXYCODONE-ACETAMINOPHEN 5-325 MG PO TABS: 1.0000 | ORAL_TABLET | ORAL | Status: DC | PRN

## 2013-04-04 NOTE — ED Notes (Signed)
Right flank pain radiating to right lower abd area started yesterday at 3:30 am, with constant nausea, diarrhea since yesterday-- 4-5 x today- with c/o excessive gas.

## 2013-04-04 NOTE — ED Provider Notes (Signed)
CSN: [id number]     Arrival date & time 04/04/13  0732 History   First MD Initiated Contact with Patient 04/04/13 [phone]     Chief Complaint  Patient presents with  . Flank Pain  . Abdominal Pain    Patient is a 42 y.o. female presenting with flank pain. The history is provided by the patient.  Flank Pain This is a new problem. The current episode started 12 to 24 hours ago. The problem occurs constantly. The problem has been gradually worsening. Pertinent negatives include no chest pain and no shortness of breath. Exacerbated by: palpation. Nothing relieves the symptoms. She has tried rest for the symptoms.  pt reports she had onset of right flank pain that radiates into her abdomen for over 24 hours She reports that she has had nausea and diarrhea (nonbloody) but no vomiting No cp/sob No dysuria She does not recall having this pain before She is s/p cholecystectomy over 2 months ago  Past Medical History  Diagnosis Date  . Arthritis   . Pneumonia   . GERD (gastroesophageal reflux disease)   . Headache(784.0)     Hx: of migraines   Past Surgical History  Procedure Laterality Date  . Cesarean section    . Colonoscopy  [id number]  . Diagnostic laparoscopy    . Adenoidectomy      Hx: of  . Tubal ligation    . Cholecystectomy N/A 02/01/2013    Procedure: LAPAROSCOPIC CHOLECYSTECTOMY WITH INTRAOPERATIVE CHOLANGIOGRAM;  Surgeon: Wilmon Arms. Corliss Skains, MD;  Location: [facility] OR;  Service: General;  Laterality: N/A;  . Laparoscopic lysis of adhesions N/A 02/01/2013    Procedure: LAPAROSCOPIC LYSIS OF ADHESIONS;  Surgeon: Wilmon Arms. Corliss Skains, MD;  Location: [facility] OR;  Service: General;  Laterality: N/A;   Family History  Problem Relation Age of Onset  . Heart disease Mother   . Hypertension Mother   . Hyperlipidemia Mother   . Arthritis Mother   . Asthma Mother   . Cancer Father   . Hypertension Father   . Hyperlipidemia Father   . Hypertension Sister   . Hyperlipidemia Sister   . Hypertension  Brother   . Hyperlipidemia Brother    History  Substance Use Topics  . Smoking status: Current Some Day Smoker -- 1.00 packs/day    Types: Cigarettes  . Smokeless tobacco: Never Used  . Alcohol Use: No   OB History   Grav Para Term Preterm Abortions TAB SAB Ect Mult Living                 Review of Systems  Constitutional: Negative for fever.  Respiratory: Negative for shortness of breath.   Cardiovascular: Negative for chest pain.  Gastrointestinal: Positive for diarrhea. Negative for blood in stool.  Genitourinary: Positive for flank pain.  All other systems reviewed and are negative.    Allergies  Review of patient's allergies indicates no known allergies.  Home Medications   Current Outpatient Rx  Name  Route  Sig  Dispense  Refill  . aspirin 325 MG tablet   Oral   Take 325 mg by mouth every 4 (four) hours as needed for pain (headache).         Marland Kitchen ibuprofen (ADVIL,MOTRIN) 200 MG tablet   Oral   Take 200 mg by mouth every 6 (six) hours as needed for pain or headache.          BP 126/67  Pulse 80  Temp(Src) 98.1 F (36.7 C) (Oral)  Resp 16  SpO2 97%  LMP 03/20/2013 Physical Exam CONSTITUTIONAL: Well developed/well nourished HEAD: Normocephalic/atraumatic EYES: EOMI/PERRL, no icterus ENMT: Mucous membranes moist NECK: supple no meningeal signs SPINE:entire spine nontender CV: S1/S2 noted, no murmurs/rubs/gallops noted LUNGS: Lungs are clear to auscultation bilaterally, no apparent distress ABDOMEN: soft, mild right upper quadrant tenderness, no rebound or guarding ZO:XWRUE cva tenderness NEURO: Pt is awake/alert, moves all extremitiesx4 EXTREMITIES: pulses normal, full ROM SKIN: warm, color normal PSYCH: no abnormalities of mood noted  ED Course  Procedures (including critical care time) Labs Review Labs Reviewed  COMPREHENSIVE METABOLIC PANEL  CBC WITH DIFFERENTIAL  LIPASE, BLOOD  URINALYSIS, ROUTINE W REFLEX MICROSCOPIC  POCT PREGNANCY,  URINE  8:29 AM Will treat pain and reassess 9:45 AM Pt sleeping on reassessment On re-exam, she has no focal abdominal tenderness.  No signs of acute pancreatitis (mild lipase elevation) I doubt acute abdominal process at this time.  Some of her pain may be related to her recent diarrhea Stable for d/c home Pt agreeable with plan MDM  No diagnosis found. Nursing notes including past medical history and social history reviewed and considered in documentation Labs/vital reviewed and considered Previous records reviewed and considered - h/o cholecystectomy per chart     Joya Gaskins, MD 04/04/13 [phone]

## 2013-04-04 NOTE — ED Notes (Signed)
Pt requests not to be given MORPHINE-- states "I don't like the way it makes me feel." Dr. Bebe Shaggy made aware

## 2013-05-15 ENCOUNTER — Encounter (HOSPITAL_COMMUNITY): Payer: Self-pay | Admitting: Emergency Medicine

## 2013-05-15 ENCOUNTER — Emergency Department (HOSPITAL_COMMUNITY)
Admission: EM | Admit: 2013-05-15 | Discharge: 2013-05-15 | Disposition: A | Discharge status: Home / Self Care | Payer: PRIVATE HEALTH INSURANCE | Source: Home / Self Care | Attending: Emergency Medicine | Admitting: Emergency Medicine

## 2013-05-15 ENCOUNTER — Emergency Department (INDEPENDENT_AMBULATORY_CARE_PROVIDER_SITE_OTHER): Payer: PRIVATE HEALTH INSURANCE

## 2013-05-15 DIAGNOSIS — F17200 Nicotine dependence, unspecified, uncomplicated: Secondary | ICD-10-CM

## 2013-05-15 DIAGNOSIS — M722 Plantar fascial fibromatosis: Secondary | ICD-10-CM

## 2013-05-15 MED ORDER — TRAMADOL HCL 50 MG PO TABS: 100.0000 mg | ORAL_TABLET | Freq: Three times a day (TID) | ORAL | Status: DC | PRN

## 2013-05-15 MED ORDER — MELOXICAM 15 MG PO TABS: 15.0000 mg | ORAL_TABLET | Freq: Every day | ORAL | Status: DC

## 2013-05-15 NOTE — ED Notes (Signed)
Patient returned to treatment room from bathroom

## 2013-05-15 NOTE — ED Provider Notes (Signed)
Chief Complaint:   Chief Complaint  Patient presents with  . Foot Pain    History of Present Illness:   Crystal Fuentes is a 42 year old female who has had a 5 to six-day history of left plantar heel pain. 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 She does a lot of twisting and pivoting. She denies any injury to the foot or the leg. The pain is constant rated 8-10 over 10 in intensity. The pain is constant although it is worse when she is on her feet, such as when she stands or walks and gets a little bit better she gets off her feet. There is swelling over the dorsum of the foot. No numbness or tingling. She denies any injury.  Review of Systems:  Other than noted above, the patient denies any of the following symptoms: Systemic:  No fevers, chills, or sweats.  No fatigue or tiredness. Musculoskeletal:  No joint pain, arthritis, bursitis, swelling, or back pain.  Neurological:  No muscular weakness, paresthesias.  PMFSH:  Past medical history, family history, social history, meds, and allergies were reviewed.  No history of gout.     Physical Exam:   Vital signs:  BP 133/79  Pulse 76  Temp(Src) 98.1 F (36.7 C) (Oral)  Resp 20  SpO2 96%  LMP 05/01/2013 Gen:  Alert and oriented times 3.  In no distress. Musculoskeletal:  Exam of the foot reveals there is pain to palpation of the insertion of the plantar fascia. The plantar fascia feels somewhat nodular in the arch of the foot and it is nontender there. There is no tenderness over the metatarsal heads, metatarsals, toes, or ankle and there is pain to palpation over the lateral and medial aspects of the calcaneus. There is no obvious swelling or edema. There is no tenderness of the Achilles. The ankle has a full range of motion with some pain on dorsiflexion.  Otherwise, all joints had a full a ROM with no swelling, bruising or deformity.  No edema, pulses full. Extremities were warm and  pink.  Capillary refill was brisk.  Skin:  Clear, warm and dry.  No rash. Neuro:  Alert and oriented times 3.  Muscle strength was normal.  Sensation was intact to light touch.   Radiology:  Dg Foot Complete Left  05/15/2013   CLINICAL DATA:  Foot pain  EXAM: LEFT FOOT - COMPLETE 3+ VIEW  COMPARISON:  None.  FINDINGS: No acute fracture or dislocation is noted. A small calcaneal spur is seen. No soft tissue abnormality is noted.  IMPRESSION: No acute abnormality seen.   Electronically Signed   By: Alcide Clever M.D.   On: 05/15/2013 12:27   I reviewed the images independently and personally and concur with the radiologist's findings.  Assessment:  The encounter diagnosis was Plantar fasciitis.  We'll treat conservatively, and she'll need followup with podiatry.  Plan:   1.  Meds:  The following meds were prescribed:   New Prescriptions   MELOXICAM (MOBIC) 15 MG TABLET    Take 1 tablet (15 mg total) by mouth daily.   TRAMADOL (ULTRAM) 50 MG TABLET    Take 2 tablets (100 mg total) by mouth every 8 (eight) hours as needed for pain.    2.  Patient Education/Counseling:  The patient was given appropriate handouts, self care instructions, and instructed in symptomatic relief including rest and activity, elevation, application of ice and  compression.    3.  Follow up:  The patient was told to follow up if no better in 3 to 4 days, if becoming worse in any way, and given some red flag symptoms such as worsening pain which would prompt immediate return.  Follow up with Dr. Cristie Hem       [name], MD 05/15/13 [phone]

## 2013-05-15 NOTE — ED Notes (Signed)
Reports left foot pain onset 5-6 days ago.  No known injury. Pain in instep of foot, pedal pulses 2 plus.

## 2013-05-17 ENCOUNTER — Ambulatory Visit (INDEPENDENT_AMBULATORY_CARE_PROVIDER_SITE_OTHER): Payer: PRIVATE HEALTH INSURANCE

## 2013-05-17 VITALS — BP 118/65 | HR 78 | Resp 16 | Ht 64.0 in | Wt 196.0 lb

## 2013-05-17 DIAGNOSIS — M722 Plantar fascial fibromatosis: Secondary | ICD-10-CM

## 2013-05-17 DIAGNOSIS — M79609 Pain in unspecified limb: Secondary | ICD-10-CM

## 2013-05-17 DIAGNOSIS — D492 Neoplasm of unspecified behavior of bone, soft tissue, and skin: Secondary | ICD-10-CM

## 2013-05-17 NOTE — Patient Instructions (Signed)
Plantar Fasciitis Plantar fasciitis is a common condition that causes foot pain. It is soreness (inflammation) of the band of tough fibrous tissue on the bottom of the foot that runs from the heel bone (calcaneus) to the ball of the foot. The cause of this soreness may be from excessive standing, poor fitting shoes, running on hard surfaces, being overweight, having an abnormal walk, or overuse (this is common in runners) of the painful foot or feet. It is also common in aerobic exercise dancers and ballet dancers. SYMPTOMS  Most people with plantar fasciitis complain of:  Severe pain in the morning on the bottom of their foot especially when taking the first steps out of bed. This pain recedes after a few minutes of walking.  Severe pain is experienced also during walking following a long period of inactivity.  Pain is worse when walking barefoot or up stairs DIAGNOSIS   Your caregiver will diagnose this condition by examining and feeling your foot.  Special tests such as X-rays of your foot, are usually not needed. PREVENTION   Consult a sports medicine professional before beginning a new exercise program.  Walking programs offer a good workout. With walking there is a lower chance of overuse injuries common to runners. There is less impact and less jarring of the joints.  Begin all new exercise programs slowly. If problems or pain develop, decrease the amount of time or distance until you are at a comfortable level.  Wear good shoes and replace them regularly.  Stretch your foot and the heel cords at the back of the ankle (Achilles tendon) both before and after exercise.  Run or exercise on even surfaces that are not hard. For example, asphalt is better than pavement.  Do not run barefoot on hard surfaces.  If using a treadmill, vary the incline.  Do not continue to workout if you have foot or joint problems. Seek professional help if they do not improve. HOME CARE INSTRUCTIONS     Avoid activities that cause you pain until you recover.  Use ice or cold packs on the problem or painful areas after working out.  Only take over-the-counter or prescription medicines for pain, discomfort, or fever as directed by your caregiver.  Soft shoe inserts or athletic shoes with air or gel sole cushions may be helpful.  If problems continue or become more severe, consult a sports medicine caregiver or your own health care provider. Cortisone is a potent anti-inflammatory medication that may be injected into the painful area. You can discuss this treatment with your caregiver. MAKE SURE YOU:   Understand these instructions.  Will watch your condition.  Will get help right away if you are not doing well or get worse. Document Released: 03/31/2001 Document Revised: 09/28/2011 Document Reviewed: 05/30/2008 [employer] Patient Information 2014 [employer] ICE INSTRUCTIONS  Apply ice or cold pack to the affected area at least 3 times a day for 10-15 minutes each time.  You should also use ice after prolonged activity or vigorous exercise.  Do not apply ice longer than 20 minutes at one time.  Always keep a cloth between your skin and the ice pack to prevent burns.  Being consistent and following these instructions will help control your symptoms.  We suggest you purchase a gel ice pack because they are reusable and do bit leak.  Some of them are designed to wrap around the area.  Use the method that works best for you.  Here are some other suggestions for   icing.   Use a frozen bag of peas or corn-inexpensive and molds well to your body, usually stays frozen for 10 to 20 minutes.  Wet a towel with cold water and squeeze out the excess until it's damp.  Place in a bag in the freezer for 20 minutes. Then remove and use.

## 2013-05-17 NOTE — Progress Notes (Signed)
  Subjective:    Patient ID: Crystal Fuentes, female    DOB: 10-15-1970, 42 y.o.   MRN: [id number] "I been having problems on my left heel and my foot has been swelling."    Foot Pain This is a new problem. The current episode started more than 1 month ago. The problem occurs daily. The problem has been gradually worsening. Associated symptoms include arthralgias, coughing and headaches. Pertinent negatives include no congestion. The symptoms are aggravated by standing and walking. She has tried NSAIDs and rest for the symptoms. The treatment provided no relief.      Review of Systems  Constitutional: Negative.   HENT: Positive for sinus pressure and sneezing. Negative for congestion.   Eyes: Positive for itching.  Respiratory: Positive for cough.   Cardiovascular: Negative.   Gastrointestinal: Negative.   Endocrine: Positive for heat intolerance.  Genitourinary: Positive for frequency.  Musculoskeletal: Positive for arthralgias and back pain.  Skin: Negative.   Allergic/Immunologic: Negative.   Neurological: Positive for headaches.  Hematological: Negative.   Psychiatric/Behavioral: Negative.        Objective:   Physical Exam On exam patient does have nodular lesions mid plantar fascia bilateral consistent with plantar fibromatosis type nodules. These are painful on palpation and on ambulation. Patient did not and she complained about her right foot however exam the right foot although not as painful as left has similar nodular lesions mid arch. However the left heel is extremely painful on palpation and exam. X-rays are reviewed revealing well-developed inferior calcaneal spur no signs of fracture or osseous abnormality.  Neurovascular status intact. Pedal pulses palpable DP postal for PT pulse one over 4 skin temperature warm turgor normal . Patient is complaints mild edema bilateral left ankle although minimal in nature there is no varicosities noted. Epicritic and proprioceptive  sensations intact and symmetric bilateral. Normal plantar response and DTRs. Orthopedic biomechanical exam otherwise unremarkable noncontributory except for the nodularity of the plantar fascia and pain in the inferior heel and arch.       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 Left more severe than right plan at this time patient apparently has been given prescriptions for meloxicam and tramadol which she has not filled at this point. Patient is encouraged to fill those prescriptions and begin using as instructed. Patient also instructed in ice application every day 2 or 3 times a day. Fascial strapping is applied both heels at this time. Patient also instructed to obtain crocs previous around the house. Avoid going barefoot or wearing any flimsy shoes or flip-flops. Reappointed in 2 weeks for followup and reevaluation. May be candidate for orthoses, or possibly steroid injections based on progress or improvement.  Alvan Dame DPM

## 2013-05-25 ENCOUNTER — Other Ambulatory Visit: Payer: Self-pay

## 2013-06-07 ENCOUNTER — Ambulatory Visit (INDEPENDENT_AMBULATORY_CARE_PROVIDER_SITE_OTHER): Payer: PRIVATE HEALTH INSURANCE

## 2013-06-07 ENCOUNTER — Telehealth: Payer: Self-pay | Admitting: *Deleted

## 2013-06-07 VITALS — BP 144/75 | HR 83 | Resp 20

## 2013-06-07 DIAGNOSIS — M79609 Pain in unspecified limb: Secondary | ICD-10-CM

## 2013-06-07 DIAGNOSIS — M722 Plantar fascial fibromatosis: Secondary | ICD-10-CM

## 2013-06-07 MED ORDER — TRIAMCINOLONE ACETONIDE 10 MG/ML IJ SUSP: 10.0000 mg | Freq: Once | INTRAMUSCULAR | Status: DC

## 2013-06-07 MED ORDER — DICLOFENAC SODIUM 75 MG PO TBEC: 75.0000 mg | DELAYED_RELEASE_TABLET | Freq: Two times a day (BID) | ORAL | Status: DC

## 2013-06-07 NOTE — Progress Notes (Signed)
  Subjective:    Patient ID: Crystal Fuentes, female    DOB: 1971/07/04, 42 y.o.   MRN: [id number] "It's not good, same as before."  HPI no changes medication her health history at this time patient cases strapping provided no relief with that the Mobic and tramadol for short time only had a short supply it did seem to help.    Review of Systems no changes in upper updates at this time no new findings.     Objective:   Physical Exam Neurovascular status is intact with pedal pulses palpable DP post-ORIF 4 PT plus one over 4 bilateral. Refill timed 3-4 seconds all digits. Skin temperature warm turgor normal no edema noted neurologically epicritic and proprioceptive sensations intact and symmetric bilateral. There is significant plantar keratosis nodules on medial band plantar fascia bilateral anterior medial band proximal to the sesamoid. The left foot is exquisitely painful at the inferior calcaneal tubercle nail insertion the right heel is tender although no tenderness at the area plantar fibromatosis itself. Both heels painful a first step in the morning or getting out following. A period of rest. No changes in medication is no change in history no new findings rectus foot type otherwise noted.     Assessment & Plan:  Assessment plantar fibromatosis and recalcitrant plantar fasciitis. Patient be sent bilateral heel pain and arch pain with no significant improvement did not take a medication for a long duration of time at this time recommended injections 10 mg Kenalog in 20 mg Xylocaine plain infiltrated to each plantar fascial area inferior left heel inferior right mid arch. Patient tolerated the injections well also placed back in fascial strapping is bilateral recommended ice and a new prescription for diclofenac is issued. Mobic aspirin or ibuprofen use at this time. Maintain a thick soled athletic shoe reappointed in 3 or 4 weeks for followup a be candidate for 30 progress. Consider additional  injections if needed. Patient also describing possibly some shooting or electric shock type sensation may be having early tarsal tunnellike symptomology can't be ruled out reevaluate again in 3 or 4 weeks  Alvan Dame DPM

## 2013-06-07 NOTE — Telephone Encounter (Addendum)
Pt states she thought she was going to get more Tramadol.  Pt states she began having shortness of breath after taking the Voltaren.  I informed pt stop the Voltaren and take Benadryl as the package directs, if symptoms worsen go to the ER.  Pt agreed.  Dr Ralene Cork had ordered Tramadol 50mg  #60 two tablet every 8 hours.  I instructed pt to pick the rx up in the [place] office.

## 2013-06-07 NOTE — Patient Instructions (Signed)

## 2013-06-09 MED ORDER — TRAMADOL HCL 50 MG PO TABS: ORAL_TABLET | ORAL | Status: DC

## 2013-07-05 ENCOUNTER — Ambulatory Visit: Payer: PRIVATE HEALTH INSURANCE

## 2013-07-26 ENCOUNTER — Ambulatory Visit (INDEPENDENT_AMBULATORY_CARE_PROVIDER_SITE_OTHER): Payer: PRIVATE HEALTH INSURANCE

## 2013-07-26 VITALS — BP 155/86 | HR 96 | Resp 12

## 2013-07-26 DIAGNOSIS — M79609 Pain in unspecified limb: Secondary | ICD-10-CM

## 2013-07-26 DIAGNOSIS — M722 Plantar fascial fibromatosis: Secondary | ICD-10-CM

## 2013-07-26 MED ORDER — TRAMADOL HCL 50 MG PO TABS: 50.0000 mg | ORAL_TABLET | Freq: Four times a day (QID) | ORAL | Status: DC

## 2013-07-26 NOTE — Progress Notes (Signed)
   Subjective:    Patient ID: Crystal Fuentes, female    DOB: March 27, 1971, 43 y.o.   MRN: [id number]  HPI Comments: '' BOTH HEEL STILL HURTING.''     Review of Systems no new changes or findings this time. Continues to have pain both heel     Objective:   Physical Exam Neurovascular status is intact patient is pain on palpation Magan plantar fascia medial taking tubercle bilateral also fibromas mid arch bilateral are still noted. Patient cases the injections only help for 1 day and the taping last for 2 or 3 days and didn't seem to help a lot. Wearing shoes or broken down patient was told are dispensed at the with her work however in fact this is likely fibromatosis in fasciitis intact with any walking standing notches from work. Is not workers comp type situation. Patient has persistent plantar fasciitis/heel spur syndrome this time dispensed some Plastizote dual density insoles an orthotics device and arch support and some cushioning to the fibroma both feet. 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

## 2013-07-26 NOTE — Patient Instructions (Signed)

## 2013-09-26 ENCOUNTER — Encounter (HOSPITAL_COMMUNITY): Payer: Self-pay | Admitting: Emergency Medicine

## 2013-09-26 ENCOUNTER — Emergency Department (HOSPITAL_COMMUNITY)
Admission: EM | Admit: 2013-09-26 | Discharge: 2013-09-26 | Disposition: A | Discharge status: Home / Self Care | Payer: PRIVATE HEALTH INSURANCE | Source: Home / Self Care | Attending: Family Medicine | Admitting: Family Medicine

## 2013-09-26 DIAGNOSIS — M5430 Sciatica, unspecified side: Secondary | ICD-10-CM

## 2013-09-26 LAB — POCT URINALYSIS DIP (DEVICE)
BILIRUBIN URINE: NEGATIVE
GLUCOSE, UA: NEGATIVE mg/dL
HGB URINE DIPSTICK: NEGATIVE
Ketones, ur: NEGATIVE mg/dL
Leukocytes, UA: NEGATIVE
NITRITE: NEGATIVE
Protein, ur: NEGATIVE mg/dL
SPECIFIC GRAVITY, URINE: 1.015 (ref 1.005–1.030)
Urobilinogen, UA: 2 mg/dL — ABNORMAL HIGH (ref 0.0–1.0)
pH: 8.5 — ABNORMAL HIGH (ref 5.0–8.0)

## 2013-09-26 LAB — POCT PREGNANCY, URINE: Preg Test, Ur: NEGATIVE

## 2013-09-26 MED ORDER — METHYLPREDNISOLONE (PAK) 4 MG PO TABS: ORAL_TABLET | ORAL | Status: DC

## 2013-09-26 NOTE — ED Notes (Signed)
C/o lower back pain and right flank pain Ibuprofen and tramadol was used as tx States she has been getting lightheaded

## 2013-09-26 NOTE — ED Provider Notes (Signed)
CSN: [id number]     Arrival date & time 09/26/13  1127 History   First MD Initiated Contact with Patient 09/26/13 1301     Chief Complaint  Patient presents with  . Flank Pain  . Back Pain   (Consider location/radiation/quality/duration/timing/severity/associated sxs/prior Treatment) HPI Comments: Patient reports 10-15 years of intermittent lower back discomfort secondary to arthritis. Reports acute exacerbation of lower back pain that began 09/23/2013 (R>L). Denies any difficulty with bowel or bladder incontinence, rash, fever, GU issues. Does endorse chronic constipation. No changes in lower extremity strength or sensation. States she has been using ibuprofen with limited relief. 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   Patient is a 43 y.o. female presenting with back pain. The history is provided by the patient.  Back Pain   Past Medical History  Diagnosis Date  . Arthritis   . Pneumonia   . GERD (gastroesophageal reflux disease)   . Headache(784.0)     Hx: of migraines   Past Surgical History  Procedure Laterality Date  . Cesarean section    . Colonoscopy  [id number]  . Diagnostic laparoscopy    . Adenoidectomy      Hx: of  . Tubal ligation    . Cholecystectomy N/A 02/01/2013    Procedure: LAPAROSCOPIC CHOLECYSTECTOMY WITH INTRAOPERATIVE CHOLANGIOGRAM;  Surgeon: Imogene Burn. Georgette Dover, MD;  Location: [facility];  Service: General;  Laterality: N/A;  . Laparoscopic lysis of adhesions N/A 02/01/2013    Procedure: LAPAROSCOPIC LYSIS OF ADHESIONS;  Surgeon: Imogene Burn. Georgette Dover, MD;  Location: [facility];  Service: General;  Laterality: N/A;   Family History  Problem Relation Age of Onset  . Heart disease Mother   . Hypertension Mother   . Hyperlipidemia Mother   . Arthritis Mother   . Asthma Mother   . Cancer Father   . Hypertension Father   . Hyperlipidemia Father   .  Hypertension Sister   . Hyperlipidemia Sister   . Hypertension Brother   . Hyperlipidemia Brother    History  Substance Use Topics  . Smoking status: Current Some Day Smoker -- 1.50 packs/day    Types: Cigarettes  . Smokeless tobacco: Never Used  . Alcohol Use: No   OB History   Grav Para Term Preterm Abortions TAB SAB Ect Mult Living                 Review of Systems  Musculoskeletal: Positive for back pain.  All other systems reviewed and are negative.    Allergies  Review of patient's allergies indicates no known allergies.  Home Medications   Current Outpatient Rx  Name  Route  Sig  Dispense  Refill  . diclofenac (VOLTAREN) 75 MG EC tablet   Oral   Take 1 tablet (75 mg total) by mouth 2 (two) times daily.   60 tablet   1   . methylPREDNIsolone (MEDROL DOSPACK) 4 MG tablet      follow package directions   21 tablet   0   . oxyCODONE-acetaminophen (PERCOCET/ROXICET) 5-325 MG per tablet   Oral   Take 1 tablet by mouth every 4 (four) hours as needed for pain.   5 tablet   0   . traMADol (ULTRAM) 50 MG tablet   Oral   Take 1 tablet (50 mg total) by mouth 4 (four) times daily.   120 tablet   0  BP 125/67  Pulse 82  Temp(Src) 98.4 F (36.9 C) (Oral)  Resp 16  SpO2 98%  LMP 09/12/2013 Physical Exam  Nursing note and vitals reviewed. Constitutional: She is oriented to person, place, and time. She appears well-developed and well-nourished. No distress.  HENT:  Head: Normocephalic and atraumatic.  Eyes: Conjunctivae are normal.  Neck: Normal range of motion. Neck supple.  Cardiovascular: Normal rate, regular rhythm and normal heart sounds.   Pulmonary/Chest: Effort normal and breath sounds normal.  Abdominal: Soft. Bowel sounds are normal. She exhibits no distension. There is no tenderness.  Musculoskeletal: Normal range of motion.       Arms: Area outlined in diagram is of discomfort with ROM of torso and with palpation. +point tenderness at right  SI joint CSM exam of lower extremities intact. Patellar DTRs 2+ bilaterally.  Neurological: She is alert and oriented to person, place, and time.  Skin: Skin is warm and dry. No rash noted.  Psychiatric: She has a normal mood and affect. Her behavior is normal.    ED Course  Procedures (including critical care time) Labs Review Labs Reviewed  POCT URINALYSIS DIP (DEVICE) - Abnormal; Notable for the following:    pH 8.5 (*)    Urobilinogen, UA 2.0 (*)    All other components within normal limits  POCT PREGNANCY, URINE   Imaging Review No results found.   MDM   1. Sciatica    Sciatica (R): Will treat with medrol dose pack and advise using tylenol for pain as directed on packaging and follow up with company's wellness clinic if symptoms do not improve over next few days.   [place], [place] 09/26/13 [phone]

## 2013-09-26 NOTE — ED Provider Notes (Signed)
Medical screening examination/treatment/procedure(s) were performed by resident physician or non-physician practitioner and as supervising physician I was immediately available for consultation/collaboration.   Pauline Good MD.   Billy Fischer, MD 09/26/13 1710

## 2013-10-29 ENCOUNTER — Emergency Department (HOSPITAL_COMMUNITY)
Admission: EM | Admit: 2013-10-29 | Discharge: 2013-10-30 | Disposition: A | Discharge status: Home / Self Care | Payer: PRIVATE HEALTH INSURANCE | Attending: Emergency Medicine | Admitting: Emergency Medicine

## 2013-10-29 ENCOUNTER — Emergency Department (HOSPITAL_COMMUNITY): Payer: PRIVATE HEALTH INSURANCE

## 2013-10-29 ENCOUNTER — Encounter (HOSPITAL_COMMUNITY): Payer: Self-pay | Admitting: Emergency Medicine

## 2013-10-29 DIAGNOSIS — Z8679 Personal history of other diseases of the circulatory system: Secondary | ICD-10-CM | POA: Insufficient documentation

## 2013-10-29 DIAGNOSIS — F17200 Nicotine dependence, unspecified, uncomplicated: Secondary | ICD-10-CM | POA: Insufficient documentation

## 2013-10-29 DIAGNOSIS — R110 Nausea: Secondary | ICD-10-CM

## 2013-10-29 DIAGNOSIS — Z79899 Other long term (current) drug therapy: Secondary | ICD-10-CM | POA: Insufficient documentation

## 2013-10-29 DIAGNOSIS — Z3202 Encounter for pregnancy test, result negative: Secondary | ICD-10-CM | POA: Insufficient documentation

## 2013-10-29 DIAGNOSIS — Z9089 Acquired absence of other organs: Secondary | ICD-10-CM | POA: Insufficient documentation

## 2013-10-29 DIAGNOSIS — R1084 Generalized abdominal pain: Secondary | ICD-10-CM | POA: Insufficient documentation

## 2013-10-29 DIAGNOSIS — Z8701 Personal history of pneumonia (recurrent): Secondary | ICD-10-CM | POA: Insufficient documentation

## 2013-10-29 DIAGNOSIS — Z8719 Personal history of other diseases of the digestive system: Secondary | ICD-10-CM | POA: Insufficient documentation

## 2013-10-29 DIAGNOSIS — B349 Viral infection, unspecified: Secondary | ICD-10-CM

## 2013-10-29 DIAGNOSIS — B9789 Other viral agents as the cause of diseases classified elsewhere: Secondary | ICD-10-CM | POA: Insufficient documentation

## 2013-10-29 DIAGNOSIS — R109 Unspecified abdominal pain: Secondary | ICD-10-CM

## 2013-10-29 DIAGNOSIS — Z8739 Personal history of other diseases of the musculoskeletal system and connective tissue: Secondary | ICD-10-CM | POA: Insufficient documentation

## 2013-10-29 LAB — I-STAT TROPONIN, ED: Troponin i, poc: 0 ng/mL (ref 0.00–0.08)

## 2013-10-29 LAB — CBC
HCT: 39.1 % (ref 36.0–46.0)
Hemoglobin: 13.7 g/dL (ref 12.0–15.0)
MCH: 32.6 pg (ref 26.0–34.0)
MCHC: 35 g/dL (ref 30.0–36.0)
MCV: 93.1 fL (ref 78.0–100.0)
PLATELETS: 199 10*3/uL (ref 150–400)
RBC: 4.2 MIL/uL (ref 3.87–5.11)
RDW: 13.7 % (ref 11.5–15.5)
WBC: 11.8 10*3/uL — AB (ref 4.0–10.5)

## 2013-10-29 LAB — LIPASE, BLOOD: Lipase: 50 U/L (ref 11–59)

## 2013-10-29 LAB — HEPATIC FUNCTION PANEL
ALBUMIN: 3.4 g/dL — AB (ref 3.5–5.2)
ALT: 10 U/L (ref 0–35)
AST: 14 U/L (ref 0–37)
Alkaline Phosphatase: 71 U/L (ref 39–117)
Bilirubin, Direct: <0.2 mg/dL (ref 0.0–0.3)
Total Bilirubin: <0.2 mg/dL — ABNORMAL LOW (ref 0.3–1.2)
Total Protein: 7.2 g/dL (ref 6.0–8.3)

## 2013-10-29 LAB — BASIC METABOLIC PANEL
BUN: 10 mg/dL (ref 6–23)
CHLORIDE: 102 meq/L (ref 96–112)
CO2: 23 meq/L (ref 19–32)
CREATININE: 0.71 mg/dL (ref 0.50–1.10)
Calcium: 9.1 mg/dL (ref 8.4–10.5)
GFR calc Af Amer: >90 mL/min (ref >90)
GFR calc non Af Amer: >90 mL/min (ref >90)
Glucose, Bld: 104 mg/dL — ABNORMAL HIGH (ref 70–99)
Potassium: 4.3 mEq/L (ref 3.7–5.3)
Sodium: 138 mEq/L (ref 137–147)

## 2013-10-29 LAB — PRO B NATRIURETIC PEPTIDE: Pro B Natriuretic peptide (BNP): 177.3 pg/mL — ABNORMAL HIGH (ref 0–125)

## 2013-10-29 MED ORDER — FAMOTIDINE IN NACL 20-0.9 MG/50ML-% IV SOLN
20.0000 mg | Freq: Once | INTRAVENOUS | Status: AC
  Administered 2013-10-30: 20 mg via INTRAVENOUS
  Filled 2013-10-29: qty 50

## 2013-10-29 MED ORDER — DICYCLOMINE HCL 10 MG/ML IM SOLN
20.0000 mg | Freq: Once | INTRAMUSCULAR | Status: AC
  Administered 2013-10-30: 20 mg via INTRAMUSCULAR
  Filled 2013-10-29: qty 2

## 2013-10-29 MED ORDER — IOHEXOL 300 MG/ML  SOLN
20.0000 mL | INTRAMUSCULAR | Status: AC
  Administered 2013-10-29: 20 mL via ORAL

## 2013-10-29 MED ORDER — MORPHINE SULFATE 4 MG/ML IJ SOLN
4.0000 mg | Freq: Once | INTRAMUSCULAR | Status: DC
  Filled 2013-10-29: qty 1

## 2013-10-29 NOTE — ED Notes (Signed)
Pt states recent surgery for gall bladder removal, and now c/o right sided upper quadrant pain that radiates substernal CP which feel like pressure, and SOB.

## 2013-10-29 NOTE — ED Notes (Signed)
Pt reports her gallbladder surgery was a year ago and has had pain since but the last three weeks the pain has increased.

## 2013-10-30 ENCOUNTER — Emergency Department (HOSPITAL_COMMUNITY): Payer: PRIVATE HEALTH INSURANCE

## 2013-10-30 LAB — URINALYSIS, ROUTINE W REFLEX MICROSCOPIC
Bilirubin Urine: NEGATIVE
Glucose, UA: NEGATIVE mg/dL
KETONES UR: NEGATIVE mg/dL
LEUKOCYTES UA: NEGATIVE
NITRITE: NEGATIVE
PH: 5.5 (ref 5.0–8.0)
Protein, ur: NEGATIVE mg/dL
Specific Gravity, Urine: 1.02 (ref 1.005–1.030)
UROBILINOGEN UA: 0.2 mg/dL (ref 0.0–1.0)

## 2013-10-30 LAB — URINE MICROSCOPIC-ADD ON

## 2013-10-30 LAB — PREGNANCY, URINE: Preg Test, Ur: NEGATIVE

## 2013-10-30 MED ORDER — IOHEXOL 300 MG/ML  SOLN
100.0000 mL | Freq: Once | INTRAMUSCULAR | Status: AC | PRN
  Administered 2013-10-30: 100 mL via INTRAVENOUS

## 2013-10-30 MED ORDER — DICYCLOMINE HCL 20 MG PO TABS: 20.0000 mg | ORAL_TABLET | Freq: Four times a day (QID) | ORAL | Status: DC | PRN

## 2013-10-30 MED ORDER — ONDANSETRON 8 MG PO TBDP: 8.0000 mg | ORAL_TABLET | Freq: Three times a day (TID) | ORAL | Status: DC | PRN

## 2013-10-30 MED ORDER — FENTANYL CITRATE 0.05 MG/ML IJ SOLN
25.0000 ug | Freq: Once | INTRAMUSCULAR | Status: AC
  Administered 2013-10-30: 25 ug via INTRAVENOUS
  Filled 2013-10-30: qty 2

## 2013-10-30 NOTE — ED Provider Notes (Signed)
CSN: [id number]     Arrival date & time 10/29/13  2140 History   First MD Initiated Contact with Patient 10/29/13 2259     Chief Complaint  Patient presents with  . Chest Pain     (Consider location/radiation/quality/duration/timing/severity/associated sxs/prior Treatment) HPI 43 year old female presents to emergency apartment with persistent right upper quadrant pain ongoing for the last 9 months since having gallbladder removed.  Patient occasionally has nausea associated with the pain.  Pain starts in right upper abdomen extends to the epigastrium and up into her chest.  Symptoms slightly worse over the last few days.  She has been eating, normal bowel movements.  Patient has been taking Ultram for other reasons, reports it has not helped her pain.  She has not followed back up with her surgeon.  Pain is sharp and crampy in nature. Past Medical History  Diagnosis Date  . Arthritis   . Pneumonia   . GERD (gastroesophageal reflux disease)   . Headache(784.0)     Hx: of migraines   Past Surgical History  Procedure Laterality Date  . Cesarean section    . Colonoscopy  [id number]  . Diagnostic laparoscopy    . Adenoidectomy      Hx: of  . Tubal ligation    . Cholecystectomy N/A 02/01/2013    Procedure: LAPAROSCOPIC CHOLECYSTECTOMY WITH INTRAOPERATIVE CHOLANGIOGRAM;  Surgeon: Imogene Burn. Georgette Dover, MD;  Location: [facility];  Service: General;  Laterality: N/A;  . Laparoscopic lysis of adhesions N/A 02/01/2013    Procedure: LAPAROSCOPIC LYSIS OF ADHESIONS;  Surgeon: Imogene Burn. Georgette Dover, MD;  Location: [facility];  Service: General;  Laterality: N/A;   Family History  Problem Relation Age of Onset  . Heart disease Mother   . Hypertension Mother   . Hyperlipidemia Mother   . Arthritis Mother   . Asthma Mother   . Cancer Father   . Hypertension Father   . Hyperlipidemia Father   . Hypertension Sister   . Hyperlipidemia Sister   . Hypertension Brother   . Hyperlipidemia Brother    History   Substance Use Topics  . Smoking status: Current Some Day Smoker -- 1.50 packs/day    Types: Cigarettes  . Smokeless tobacco: Never Used  . Alcohol Use: No   OB History   Grav Para Term Preterm Abortions TAB SAB Ect Mult Living                 Review of Systems   See History of Present Illness; otherwise all other systems are reviewed and negative  Allergies  Review of patient's allergies indicates no known allergies.  Home Medications   Current Outpatient Rx  Name  Route  Sig  Dispense  Refill  . albuterol (PROVENTIL HFA;VENTOLIN HFA) 108 (90 BASE) MCG/ACT inhaler   Inhalation   Inhale 2 puffs into the lungs every 6 (six) hours as needed for wheezing or shortness of breath.         . cetirizine (ZYRTEC) 10 MG tablet   Oral   Take 10 mg by mouth at bedtime.         . traMADol (ULTRAM) 50 MG tablet   Oral   Take 1 tablet (50 mg total) by mouth 4 (four) times daily.   120 tablet   0    BP 140/69  Pulse 79  Temp(Src) 98.7 F (37.1 C) (Oral)  Resp 17  SpO2 94%  LMP 10/17/2013 Physical Exam  Nursing note and vitals reviewed. Constitutional: She is oriented to  person, place, and time. She appears well-developed and well-nourished. No distress.  HENT:  Head: Normocephalic and atraumatic.  Nose: Nose normal.  Mouth/Throat: Oropharynx is clear and moist.  Eyes: Conjunctivae and EOM are normal. Pupils are equal, round, and reactive to light.  Neck: Normal range of motion. Neck supple. No JVD present. No tracheal deviation present. No thyromegaly present.  Cardiovascular: Normal rate, regular rhythm, normal heart sounds and intact distal pulses.  Exam reveals no gallop and no friction rub.   No murmur heard. Pulmonary/Chest: Effort normal and breath sounds normal. No stridor. No respiratory distress. She has no wheezes. She has no rales. She exhibits no tenderness.  Abdominal: Soft. Bowel sounds are normal. She exhibits no distension and no mass. There is tenderness  (diffuse abdominal pain, worse in upper abdomen, but she does have some pain with palpation to left lower quadrant). There is no rebound and no guarding.  Musculoskeletal: Normal range of motion. She exhibits no edema and no tenderness.  Lymphadenopathy:    She has no cervical adenopathy.  Neurological: She is alert and oriented to person, place, and time. She exhibits normal muscle tone. Coordination normal.  Skin: Skin is warm and dry. No rash noted. No erythema. No pallor.  Psychiatric: She has a normal mood and affect. Her behavior is normal. Judgment and thought content normal.    ED Course  Procedures (including critical care time) Labs Review Labs Reviewed  CBC - Abnormal; Notable for the following:    WBC 11.8 (*)    All other components within normal limits  BASIC METABOLIC PANEL - Abnormal; Notable for the following:    Glucose, Bld 104 (*)    All other components within normal limits  PRO B NATRIURETIC PEPTIDE - Abnormal; Notable for the following:    Pro B Natriuretic peptide (BNP) 177.3 (*)    All other components within normal limits  HEPATIC FUNCTION PANEL - Abnormal; Notable for the following:    Albumin 3.4 (*)    Total Bilirubin <0.2 (*)    All other components within normal limits  URINALYSIS, ROUTINE W REFLEX MICROSCOPIC - Abnormal; Notable for the following:    Hgb urine dipstick TRACE (*)    All other components within normal limits  URINE MICROSCOPIC-ADD ON - Abnormal; Notable for the following:    Squamous Epithelial / LPF FEW (*)    Bacteria, UA FEW (*)    All other components within normal limits  LIPASE, BLOOD  PREGNANCY, URINE  I-STAT TROPOININ, ED   Imaging Review Dg Chest 2 View  10/29/2013   CLINICAL DATA:  Chest pain extending into the neck for 1 year. Possible constipation.  EXAM: CHEST  2 VIEW  COMPARISON:  DG CHEST 2 VIEW dated 01/10/2013  FINDINGS: Cardiomediastinal silhouette is unremarkable. The lungs are clear without pleural effusions or  focal consolidations. Similar slightly increased lung markings and mild flattening of the hemidiaphragms. Trachea projects midline and there is no pneumothorax. Soft tissue planes and included osseous structures are non-suspicious. Surgical clips in the abdomen likely reflect cholecystectomy.  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   Electronically Signed   By: Elon Alas   On: 10/29/2013 23:18   Ct Abdomen Pelvis W Contrast  10/30/2013   CLINICAL DATA:  Right upper quadrant abdominal pain. Prior cholecystectomy in July, 2014.  EXAM: CT ABDOMEN AND PELVIS WITH CONTRAST  TECHNIQUE: Multidetector CT imaging of the abdomen and pelvis was performed using the standard protocol following bolus  administration of intravenous contrast.  CONTRAST:  148mL OMNIPAQUE IOHEXOL 300 MG/ML  SOLN  COMPARISON:  DG CHOLANGIOGRAM OPERATIVE dated 02/01/2013; US ABDOMEN COMPLETE dated 01/10/2013  FINDINGS: Visualized lung bases show a well-circumscribed 5 mm noncalcified nodule in the lateral aspect of the right lower lobe.  The gallbladder has been removed. There is no evidence of gallbladder fossa of fluid collection or biliary ductal dilatation. No focal abscess is identified in the abdomen or pelvis.  The spleen, pancreas, adrenal glands and kidneys are within normal limits. Bowel loops show no evidence of obstruction or inflammation. The bladder is unremarkable. The uterus and adnexal regions are unremarkable with a small amount of free fluid identified in the pelvis. Focal cyst in the right ovary/ adnexa and measures 1.6 cm and is likely physiologic.  There are some adjacent prominent lymph nodes in the right inguinal region. The largest measures approximately 1.4 x 2.0 cm. Some smaller inguinal lymph nodes are present on the left. No other enlarged lymph nodes are seen in the abdomen or pelvis. No hernias are identified. Bony structures are unremarkable.  IMPRESSION: 1. 5 mm  nodule in the lateral right lower lobe. If the patient is at high risk for bronchogenic carcinoma, follow-up chest CT at 6-12 months is recommended. If the patient is at low risk for bronchogenic carcinoma, follow-up chest CT at 12 months is recommended. This recommendation follows the consensus statement: Guidelines for Management of Small Pulmonary Nodules Detected on CT Scans: A Statement from the [place] as published in Radiology 2005;237:395-400. 2. No evidence of complication following cholecystectomy and no evidence of biliary dilatation. 3. Prominent right inguinal lymph nodes. This is nonspecific. Clinical followup is recommended.   Electronically Signed   By: Aletta Edouard M.D.   On: 10/30/2013 01:21     EKG Interpretation   Date/Time:  Sunday October 29 2013 21:53:44 EDT Ventricular Rate:  84 PR Interval:  148 QRS Duration: 72 QT Interval:  364 QTC Calculation: 430 R Axis:   74 Text Interpretation:  Normal sinus rhythm Anterior infarct , age  undetermined Abnormal ECG No significant change since last tracing  Confirmed by KNAPP  MD-J, JON ([id number]) on 10/29/2013 10:55:10 PM      MDM   Final diagnoses:  Abdominal pain  Nausea  Viral syndrome    43 year old female with persistent upper abdominal pain since having gallbladder removed.  Labs unremarkable.  Chest x-ray with early COPD.  Patient is a smoker.  Given the ongoing symptoms, plan for CT scan, we'll treat pain and nausea.  Negative w/u.  Pt feeling somewhat better.  Will d/c home with f/u with pcm, bentyl, zofran.    Kalman Drape, MD 10/30/13 [phone]

## 2013-10-30 NOTE — ED Notes (Signed)
Pt A&OX4, ambulatory at discharge, verbalizing no complaints at this time. 

## 2013-10-30 NOTE — Discharge Instructions (Signed)
Take medications as prescribed.  Follow up with your doctor for further workup and evaluation.  Your CT today showed a nodule in your right lung that will need to be followed up in 6 months.  This can be done through your primary care doctor.  No abdominal abnormalities were noted.  Your labs were normal.     Abdominal Pain, Women Abdominal (stomach, pelvic, or belly) pain can be caused by many things. It is important to tell your doctor:  The location of the pain.  Does it come and go or is it present all the time?  Are there things that start the pain (eating certain foods, exercise)?  Are there other symptoms associated with the pain (fever, nausea, vomiting, diarrhea)? All of this is helpful to know when trying to find the cause of the pain. CAUSES   Stomach: virus or bacteria infection, or ulcer.  Intestine: appendicitis (inflamed appendix), regional ileitis (Crohn's disease), ulcerative colitis (inflamed colon), irritable bowel syndrome, diverticulitis (inflamed diverticulum of the colon), or cancer of the stomach or intestine.  Gallbladder disease or stones in the gallbladder.  Kidney disease, kidney stones, or infection.  Pancreas infection or cancer.  Fibromyalgia (pain disorder).  Diseases of the female organs:  Uterus: fibroid (non-cancerous) tumors or infection.  Fallopian tubes: infection or tubal pregnancy.  Ovary: cysts or tumors.  Pelvic adhesions (scar tissue).  Endometriosis (uterus lining tissue growing in the pelvis and on the pelvic organs).  Pelvic congestion syndrome (female organs filling up with blood just before the menstrual period).  Pain with the menstrual period.  Pain with ovulation (producing an egg).  Pain with an IUD (intrauterine device, birth control) in the uterus.  Cancer of the female organs.  Functional pain (pain not caused by a disease, may improve without treatment).  Psychological pain.  Depression. DIAGNOSIS  Your  doctor will decide the seriousness of your pain by doing an examination.  Blood tests.  X-rays.  Ultrasound.  CT scan (computed tomography, special type of X-ray).  MRI (magnetic resonance imaging).  Cultures, for infection.  Barium enema (dye inserted in the large intestine, to better view it with X-rays).  Colonoscopy (looking in intestine with a lighted tube).  Laparoscopy (minor surgery, looking in abdomen with a lighted tube).  Major abdominal exploratory surgery (looking in abdomen with a large incision). TREATMENT  The treatment will depend on the cause of the pain.   Many cases can be observed and treated at home.  Over-the-counter medicines recommended by your caregiver.  Prescription medicine.  Antibiotics, for infection.  Birth control pills, for painful periods or for ovulation pain.  Hormone treatment, for endometriosis.  Nerve blocking injections.  Physical therapy.  Antidepressants.  Counseling with a psychologist or psychiatrist.  Minor or major surgery. HOME CARE INSTRUCTIONS   Do not take laxatives, unless directed by your caregiver.  Take over-the-counter pain medicine only if ordered by your caregiver. Do not take aspirin because it can cause an upset stomach or bleeding.  Try a clear liquid diet (broth or water) as ordered by your caregiver. Slowly move to a bland diet, as tolerated, if the pain is related to the stomach or intestine.  Have a thermometer and take your temperature several times a day, and record it.  Bed rest and sleep, if it helps the pain.  Avoid sexual intercourse, if it causes pain.  Avoid stressful situations.  Keep your follow-up appointments and tests, as your caregiver orders.  If the pain does not  go away with medicine or surgery, you may try:  Acupuncture.  Relaxation exercises (yoga, meditation).  Group therapy.  Counseling. SEEK MEDICAL CARE IF:   You notice certain foods cause stomach  pain.  Your home care treatment is not helping your pain.  You need stronger pain medicine.  You want your IUD removed.  You feel faint or lightheaded.  You develop nausea and vomiting.  You develop a rash.  You are having side effects or an allergy to your medicine. SEEK IMMEDIATE MEDICAL CARE IF:   Your pain does not go away or gets worse.  You have a fever.  Your pain is felt only in portions of the abdomen. The right side could possibly be appendicitis. The left lower portion of the abdomen could be colitis or diverticulitis.  You are passing blood in your stools (bright red or black tarry stools, with or without vomiting).  You have blood in your urine.  You develop chills, with or without a fever.  You pass out. MAKE SURE YOU:   Understand these instructions.  Will watch your condition.  Will get help right away if you are not doing well or get worse. Document Released: 05/03/2007 Document Revised: 09/28/2011 Document Reviewed: 05/23/2009 [facility] Patient Information 2014 [place], [place].  Nausea, Adult Nausea means you feel sick to your stomach or need to throw up (vomit). It may be a sign of a more serious problem. If nausea gets worse, you may throw up. If you throw up a lot, you may lose too much body fluid (dehydration). HOME CARE   Get plenty of rest.  Ask your doctor how to replace body fluid losses (rehydrate).  Eat small amounts of food. Sip liquids more often.  Take all medicines as told by your doctor. GET HELP RIGHT AWAY IF:  You have a fever.  You pass out (faint).  You keep throwing up or have blood in your throw up.  You are very weak, have dry lips or a dry mouth, or you are very thirsty (dehydrated).  You have dark or bloody poop (stool).  You have very bad chest or belly (abdominal) pain.  You do not get better after 2 days, or you get worse.  You have a headache. MAKE SURE YOU:  Understand these instructions.  Will watch  your condition.  Will get help right away if you are not doing well or get worse. Document Released: 06/25/2011 Document Revised: 09/28/2011 Document Reviewed: 06/25/2011 [facility] Patient Information 2014 [place], [place].  Pain of Unknown Etiology (Pain Without a Known Cause) You have come to your caregiver because of pain. Pain can occur in any part of the body. Often there is not a definite cause. If your laboratory (blood or urine) work was normal and X-rays or other studies were normal, your caregiver may treat you without knowing the cause of the pain. An example of this is the headache. Most headaches are diagnosed by taking a history. This means your caregiver asks you questions about your headaches. Your caregiver determines a treatment based on your answers. Usually testing done for headaches is normal. Often testing is not done unless there is no response to medications. Regardless of where your pain is located today, you can be given medications to make you comfortable. If no physical cause of pain can be found, most cases of pain will gradually leave as suddenly as they came.  If you have a painful condition and no reason can be found for the pain, it  is important that you follow up with your caregiver. If the pain becomes worse or does not go away, it may be necessary to repeat tests and look further for a possible cause.  Only take over-the-counter or prescription medicines for pain, discomfort, or fever as directed by your caregiver.  For the protection of your privacy, test results cannot be given over the phone. Make sure you receive the results of your test. Ask how these results are to be obtained if you have not been informed. It is your responsibility to obtain your test results.  You may continue all activities unless the activities cause more pain. When the pain lessens, it is important to gradually resume normal activities. Resume activities by beginning slowly and gradually  increasing the intensity and duration of the activities or exercise. During periods of severe pain, bed rest may be helpful. Lie or sit in any position that is comfortable.  Ice used for acute (sudden) conditions may be effective. Use a large plastic bag filled with ice and wrapped in a towel. This may provide pain relief.  See your caregiver for continued problems. Your caregiver can help or refer you for exercises or physical therapy if necessary. If you were given medications for your condition, do not drive, operate machinery or power tools, or sign legal documents for 24 hours. Do not drink alcohol, take sleeping pills, or take other medications that may interfere with treatment. See your caregiver immediately if you have pain that is becoming worse and not relieved by medications. Document Released: 03/31/2001 Document Revised: 04/26/2013 Document Reviewed: 07/06/2005 [facility] Patient Information 2014 [place].  Viral Infections A virus is a type of germ. Viruses can cause:  Minor sore throats.  Aches and pains.  Headaches.  Runny nose.  Rashes.  Watery eyes.  Tiredness.  Coughs.  Loss of appetite.  Feeling sick to your stomach (nausea).  Throwing up (vomiting).  Watery poop (diarrhea). HOME CARE   Only take medicines as told by your doctor.  Drink enough water and fluids to keep your pee (urine) clear or pale yellow. Sports drinks are a good choice.  Get plenty of rest and eat healthy. Soups and broths with crackers or rice are fine. GET HELP RIGHT AWAY IF:   You have a very bad headache.  You have shortness of breath.  You have chest pain or neck pain.  You have an unusual rash.  You cannot stop throwing up.  You have watery poop that does not stop.  You cannot keep fluids down.  You or your child has a temperature by mouth above 102 F (38.9 C), not controlled by medicine.  Your baby is older than 3 months with a rectal temperature of 102  F (38.9 C) or higher.  Your baby is 69 months old or younger with a rectal temperature of 100.4 F (38 C) or higher. MAKE SURE YOU:   Understand these instructions.  Will watch this condition.  Will get help right away if you are not doing well or get worse. Document Released: 06/18/2008 Document Revised: 09/28/2011 Document Reviewed: 11/11/2010 [facility] Patient Information 2014 [place], [place].

## 2014-03-20 HISTORY — PX: CARPAL TUNNEL RELEASE: SHX101

## 2014-05-16 ENCOUNTER — Encounter (HOSPITAL_COMMUNITY): Payer: Self-pay | Admitting: Emergency Medicine

## 2014-05-16 ENCOUNTER — Emergency Department (INDEPENDENT_AMBULATORY_CARE_PROVIDER_SITE_OTHER)
Admission: EM | Admit: 2014-05-16 | Discharge: 2014-05-16 | Disposition: A | Discharge status: Home / Self Care | Payer: PRIVATE HEALTH INSURANCE | Source: Home / Self Care | Attending: Emergency Medicine | Admitting: Emergency Medicine

## 2014-05-16 DIAGNOSIS — M79601 Pain in right arm: Secondary | ICD-10-CM

## 2014-05-16 DIAGNOSIS — M7989 Other specified soft tissue disorders: Secondary | ICD-10-CM

## 2014-05-16 NOTE — Discharge Instructions (Signed)
Dr. Jodelle Gross will see you now, please go directly to his office.

## 2014-05-16 NOTE — ED Notes (Signed)
Patient instructed to go right now to her Surgeon  For F/U care.  She is to be seen today by him at 2pm.  Patient knows how to get there and will go straight there right now.

## 2014-05-16 NOTE — ED Provider Notes (Signed)
CSN: [id number]     Arrival date & time 05/16/14  1118 History   First MD Initiated Contact with Patient 05/16/14 1208     Chief Complaint  Patient presents with  . Hand Pain   (Consider location/radiation/quality/duration/timing/severity/associated sxs/prior Treatment) HPI      43 year old female presents for evaluation of a postoperative complication. She had surgery on her right wrist for carpal tunnel syndrome as well as surgery for 2 trigger fingers in the right hand done approximately 5 weeks ago. Since that time, she has had continued pain and swelling of the entire right arm and wrist as well as erythema and tenderness. She says that she has tried to follow-up with her surgeon but she cannot get in touch with them. She did not try going by their office. She has subjective numbness in the hand as well. No systemic symptoms. She says the pain is severe.  Past Medical History  Diagnosis Date  . Arthritis   . Pneumonia   . GERD (gastroesophageal reflux disease)   . Headache(784.0)     Hx: of migraines   Past Surgical History  Procedure Laterality Date  . Cesarean section    . Colonoscopy  [id number]  . Diagnostic laparoscopy    . Adenoidectomy      Hx: of  . Tubal ligation    . Cholecystectomy N/A 02/01/2013    Procedure: LAPAROSCOPIC CHOLECYSTECTOMY WITH INTRAOPERATIVE CHOLANGIOGRAM;  Surgeon: Imogene Burn. Georgette Dover, MD;  Location: [place];  Service: General;  Laterality: N/A;  . Laparoscopic lysis of adhesions N/A 02/01/2013    Procedure: LAPAROSCOPIC LYSIS OF ADHESIONS;  Surgeon: Imogene Burn. Georgette Dover, MD;  Location: [facility];  Service: General;  Laterality: N/A;   Family History  Problem Relation Age of Onset  . Heart disease Mother   . Hypertension Mother   . Hyperlipidemia Mother   . Arthritis Mother   . Asthma Mother   . Cancer Father   . Hypertension Father   . Hyperlipidemia Father   . Hypertension Sister   . Hyperlipidemia Sister   . Hypertension Brother   . Hyperlipidemia  Brother    History  Substance Use Topics  . Smoking status: Current Some Day Smoker -- 1.50 packs/day    Types: Cigarettes  . Smokeless tobacco: Never Used  . Alcohol Use: No   OB History   Grav Para Term Preterm Abortions TAB SAB Ect Mult Living                 Review of Systems  Musculoskeletal:       Right arm pain, right wrist and hand pain and swelling  All other systems reviewed and are negative.   Allergies  Lyrica  Home Medications   Prior to Admission medications   Medication Sig Start Date End Date Taking? Authorizing Provider  albuterol (PROVENTIL HFA;VENTOLIN HFA) 108 (90 BASE) MCG/ACT inhaler Inhale 2 puffs into the lungs every 6 (six) hours as needed for wheezing or shortness of breath.    Historical Provider, MD  cetirizine (ZYRTEC) 10 MG tablet Take 10 mg by mouth at bedtime.    Historical Provider, MD  dicyclomine (BENTYL) 20 MG tablet Take 1 tablet (20 mg total) by mouth every 6 (six) hours as needed for spasms (for abdominal cramping). 10/30/13   Kalman Drape, MD  ondansetron (ZOFRAN ODT) 8 MG disintegrating tablet Take 1 tablet (8 mg total) by mouth every 8 (eight) hours as needed for nausea or vomiting. 10/30/13   Kalman Drape,  MD  traMADol (ULTRAM) 50 MG tablet Take 1 tablet (50 mg total) by mouth 4 (four) times daily. 07/26/13   Richard Blenda Mounts, DPM   BP 131/77  Pulse 76  Temp(Src) 98 F (36.7 C) (Oral)  Resp 16  SpO2 97%  LMP 05/13/2014 Physical Exam  Nursing note and vitals reviewed. Constitutional: She is oriented to person, place, and time. Vital signs are normal. She appears well-developed and well-nourished. No distress.  HENT:  Head: Normocephalic and atraumatic.  Cardiovascular:  Pulses:      Radial pulses are 2+ on the right side.  Pulmonary/Chest: Effort normal. No respiratory distress.  Musculoskeletal:       Right hand: She exhibits decreased range of motion, tenderness (diffuse) and swelling. She exhibits normal capillary refill. Normal  sensation noted. Decreased strength noted.  Diffuse erythema, tenderness, swelling of the right upper extremity to the mid forearm  Neurological: She is alert and oriented to person, place, and time. She has normal strength. Coordination normal.  Skin: Skin is warm and dry. No rash noted. She is not diaphoretic.  Psychiatric: She has a normal mood and affect. Judgment normal.    ED Course  Procedures (including critical care time) Labs Review Labs Reviewed - No data to display  Imaging Review No results found.   MDM   1. Swelling of right hand   2. Right arm pain    Differential includes infection, DVT of the upper extremity. I have called her surgeon's office, they would prefer to see this patient and have asked [place] to send her there now. It is appropriate that her surgeon should manage her postoperative problems. She'll be discharged and she agrees to go straight to her surgeon's office.      Liam Graham, PA-C 05/16/14 1328

## 2014-05-17 NOTE — ED Provider Notes (Signed)
Medical screening examination/treatment/procedure(s) were performed by non-physician practitioner and as supervising physician I was immediately available for consultation/collaboration.  Philipp Deputy, M.D.  Harden Mo, MD 05/17/14 [id number]

## 2014-05-30 ENCOUNTER — Emergency Department (HOSPITAL_COMMUNITY): Payer: PRIVATE HEALTH INSURANCE

## 2014-05-30 ENCOUNTER — Emergency Department (HOSPITAL_COMMUNITY)
Admission: EM | Admit: 2014-05-30 | Discharge: 2014-05-30 | Disposition: A | Discharge status: Home / Self Care | Payer: PRIVATE HEALTH INSURANCE | Attending: Emergency Medicine | Admitting: Emergency Medicine

## 2014-05-30 ENCOUNTER — Encounter (HOSPITAL_COMMUNITY): Payer: Self-pay | Admitting: Emergency Medicine

## 2014-05-30 DIAGNOSIS — E669 Obesity, unspecified: Secondary | ICD-10-CM | POA: Insufficient documentation

## 2014-05-30 DIAGNOSIS — Z8701 Personal history of pneumonia (recurrent): Secondary | ICD-10-CM | POA: Insufficient documentation

## 2014-05-30 DIAGNOSIS — R0602 Shortness of breath: Secondary | ICD-10-CM

## 2014-05-30 DIAGNOSIS — Z8739 Personal history of other diseases of the musculoskeletal system and connective tissue: Secondary | ICD-10-CM | POA: Diagnosis not present

## 2014-05-30 DIAGNOSIS — Z79899 Other long term (current) drug therapy: Secondary | ICD-10-CM | POA: Insufficient documentation

## 2014-05-30 DIAGNOSIS — Z8719 Personal history of other diseases of the digestive system: Secondary | ICD-10-CM | POA: Insufficient documentation

## 2014-05-30 DIAGNOSIS — Z8669 Personal history of other diseases of the nervous system and sense organs: Secondary | ICD-10-CM | POA: Diagnosis not present

## 2014-05-30 DIAGNOSIS — J441 Chronic obstructive pulmonary disease with (acute) exacerbation: Secondary | ICD-10-CM | POA: Diagnosis not present

## 2014-05-30 DIAGNOSIS — Z720 Tobacco use: Secondary | ICD-10-CM | POA: Diagnosis not present

## 2014-05-30 HISTORY — DX: Carpal tunnel syndrome, unspecified upper limb: G56.00

## 2014-05-30 HISTORY — DX: Chronic obstructive pulmonary disease, unspecified: J44.9

## 2014-05-30 HISTORY — DX: Trigger finger, unspecified finger: M65.30

## 2014-05-30 HISTORY — DX: Plantar fascial fibromatosis: M72.2

## 2014-05-30 LAB — CBC
HEMATOCRIT: 35.5 % — AB (ref 36.0–46.0)
HEMOGLOBIN: 11.5 g/dL — AB (ref 12.0–15.0)
MCH: 31.4 pg (ref 26.0–34.0)
MCHC: 32.4 g/dL (ref 30.0–36.0)
MCV: 97 fL (ref 78.0–100.0)
PLATELETS: 183 10*3/uL (ref 150–400)
RBC: 3.66 MIL/uL — AB (ref 3.87–5.11)
RDW: 14.3 % (ref 11.5–15.5)
WBC: 11.2 10*3/uL — ABNORMAL HIGH (ref 4.0–10.5)

## 2014-05-30 LAB — I-STAT TROPONIN, ED: Troponin i, poc: 0.02 ng/mL (ref 0.00–0.08)

## 2014-05-30 LAB — BASIC METABOLIC PANEL
Anion gap: 13 (ref 5–15)
BUN: 15 mg/dL (ref 6–23)
CO2: 22 meq/L (ref 19–32)
Calcium: 8.8 mg/dL (ref 8.4–10.5)
Chloride: 103 mEq/L (ref 96–112)
Creatinine, Ser: 0.62 mg/dL (ref 0.50–1.10)
GFR calc Af Amer: >90 mL/min (ref >90)
GLUCOSE: 167 mg/dL — AB (ref 70–99)
POTASSIUM: 4.7 meq/L (ref 3.7–5.3)
Sodium: 138 mEq/L (ref 137–147)

## 2014-05-30 LAB — PRO B NATRIURETIC PEPTIDE: PRO B NATRI PEPTIDE: 495.1 pg/mL — AB (ref 0–125)

## 2014-05-30 LAB — D-DIMER, QUANTITATIVE (NOT AT ARMC): D DIMER QUANT: 0.83 ug{FEU}/mL — AB (ref 0.00–0.48)

## 2014-05-30 MED ORDER — IOHEXOL 350 MG/ML SOLN
100.0000 mL | Freq: Once | INTRAVENOUS | Status: AC | PRN
  Administered 2014-05-30: 100 mL via INTRAVENOUS

## 2014-05-30 MED ORDER — ALBUTEROL SULFATE (2.5 MG/3ML) 0.083% IN NEBU
5.0000 mg | INHALATION_SOLUTION | Freq: Once | RESPIRATORY_TRACT | Status: AC
  Administered 2014-05-30: 5 mg via RESPIRATORY_TRACT
  Filled 2014-05-30: qty 6

## 2014-05-30 MED ORDER — FUROSEMIDE 10 MG/ML IJ SOLN
40.0000 mg | Freq: Once | INTRAMUSCULAR | Status: AC
  Administered 2014-05-30: 40 mg via INTRAVENOUS
  Filled 2014-05-30: qty 4

## 2014-05-30 MED ORDER — IPRATROPIUM BROMIDE 0.02 % IN SOLN
0.5000 mg | Freq: Once | RESPIRATORY_TRACT | Status: AC
  Administered 2014-05-30: 0.5 mg via RESPIRATORY_TRACT
  Filled 2014-05-30: qty 2.5

## 2014-05-30 NOTE — ED Notes (Signed)
Pt here for sob and tightness in chest, onset 1 am, breathing labored on arrival.

## 2014-05-30 NOTE — ED Notes (Signed)
[place] at bedside

## 2014-05-30 NOTE — ED Provider Notes (Signed)
CSN: [id number]     Arrival date & time 05/30/14  1157 History   First MD Initiated Contact with Patient 05/30/14 1218     Chief Complaint  Patient presents with  . Shortness of Breath     (Consider location/radiation/quality/duration/timing/severity/associated sxs/prior Treatment) HPI   43 year old female with history of pneumonia in the past, COPD, GERD who presents complaining of shortness of breath.  Patient reports gradual onset of chest discomfort, increased shortness of breath and occasional cough ongoing for the past week. Patient felt "something is in my chest" that is making it difficult for her to breathe. Symptoms seems to worsen with ambulation and occasionally with laying flat. She was initially seen at urgent care 3 days ago for this, and was treated with steroid, cough medication, and Z-Pak but states it provides absolutely no relief. She took an inhaler this morning after having some mild wheezing but it did not help. The symptom is making her more anxious. She is a chronic smoker but states she has not been smoking as much recently. She admits to having history of mild COPD without any specific treatment. Report trying to sleep on 5 pillows today to find some comfort.  She reports having a right carpal tunnel release surgery more than a month ago. She noticed that both her ankle has been swollen more than usual. She is not on any birth control and has no active cancer. She does admits to having a strong family history of cardiac disease. She denies fever, hemoptysis, pleuritic chest pain, abdominal pain, nausea or vomiting, lightheadedness, dizziness, or diaphoresis. No prior history of PE or DVT.  Pt mentioned she has a new heart murmur diagnosed a few months ago.    Past Medical History  Diagnosis Date  . Arthritis   . Pneumonia   . GERD (gastroesophageal reflux disease)   . Headache(784.0)     Hx: of migraines  . COPD (chronic obstructive pulmonary disease)   . Carpal  tunnel syndrome   . Trigger finger   . Plantar fasciitis    Past Surgical History  Procedure Laterality Date  . Cesarean section    . Colonoscopy  [id number]  . Diagnostic laparoscopy    . Adenoidectomy      Hx: of  . Tubal ligation    . Cholecystectomy N/A 02/01/2013    Procedure: LAPAROSCOPIC CHOLECYSTECTOMY WITH INTRAOPERATIVE CHOLANGIOGRAM;  Surgeon: Imogene Burn. Georgette Dover, MD;  Location: [place];  Service: General;  Laterality: N/A;  . Laparoscopic lysis of adhesions N/A 02/01/2013    Procedure: LAPAROSCOPIC LYSIS OF ADHESIONS;  Surgeon: Imogene Burn. Georgette Dover, MD;  Location: [facility];  Service: General;  Laterality: N/A;   Family History  Problem Relation Age of Onset  . Heart disease Mother   . Hypertension Mother   . Hyperlipidemia Mother   . Arthritis Mother   . Asthma Mother   . Cancer Father   . Hypertension Father   . Hyperlipidemia Father   . Hypertension Sister   . Hyperlipidemia Sister   . Hypertension Brother   . Hyperlipidemia Brother    History  Substance Use Topics  . Smoking status: Current Some Day Smoker -- 1.50 packs/day    Types: Cigarettes  . Smokeless tobacco: Never Used  . Alcohol Use: No   OB History    No data available     Review of Systems  All other systems reviewed and are negative.     Allergies  Lyrica  Home Medications   Prior to  Admission medications   Medication Sig Start Date End Date Taking? Authorizing Provider  albuterol (PROVENTIL HFA;VENTOLIN HFA) 108 (90 BASE) MCG/ACT inhaler Inhale 2 puffs into the lungs every 6 (six) hours as needed for wheezing or shortness of breath.    Historical Provider, MD  cetirizine (ZYRTEC) 10 MG tablet Take 10 mg by mouth at bedtime.    Historical Provider, MD  dicyclomine (BENTYL) 20 MG tablet Take 1 tablet (20 mg total) by mouth every 6 (six) hours as needed for spasms (for abdominal cramping). 10/30/13   Kalman Drape, MD  ondansetron (ZOFRAN ODT) 8 MG disintegrating tablet Take 1 tablet (8 mg total) by  mouth every 8 (eight) hours as needed for nausea or vomiting. 10/30/13   Kalman Drape, MD  traMADol (ULTRAM) 50 MG tablet Take 1 tablet (50 mg total) by mouth 4 (four) times daily. 07/26/13   Richard Sikora, DPM   BP 120/60 mmHg  Pulse 78  Temp(Src) 97.9 F (36.6 C) (Oral)  Resp 22  Ht 5\' 3"  (1.6 m)  Wt 200 lb (90.719 kg)  BMI 35.44 kg/m2  SpO2 98%  LMP 05/13/2014 Physical Exam  Constitutional: She is oriented to person, place, and time. She appears well-developed and well-nourished. No distress.  Moderately obese Caucasian females appear mildly tachypneic.  HENT:  Head: Atraumatic.  Mouth/Throat: Oropharynx is clear and moist.  Eyes: Conjunctivae are normal.  Neck: Neck supple. No JVD present.  Cardiovascular: Normal rate and regular rhythm.  Exam reveals no gallop and no friction rub.   No murmur heard. Pulmonary/Chest:  tachypneic but no tachycardia. Decreased lung sounds throughout with faint expiratory wheezes, no rales or rhonchi heard  Abdominal: Soft. Bowel sounds are normal. There is no tenderness.  Musculoskeletal: She exhibits edema (trace pitting edema to bilateral lower extremities, no palpable cords, no erythema, distal pedal pulse palpable.).  Neurological: She is alert and oriented to person, place, and time.  Skin: No rash noted.  Psychiatric: She has a normal mood and affect.  Nursing note and vitals reviewed.   ED Course  Procedures (including critical care time)  12:38 PM Patient he was short of breath, likely COPD exacerbation giving decreased breath sounds and occasional wheezes heard on exam. She did had a remote surgical history and therefore not PERC negative, will check D-Dimer given that she was treated for COPD exacerbation with steroid, zpak, and cough medication but it provides no relief.  Some evidence of fluid overload, will check BNP.  Cardiac workup initiated as well.    1:58 PM Mild CHF on CXR.  Pt does have elevated D-dimer at 0.83, normal  renal function therefore i will obtain chest CT angio for further evaluation and r/o PE.  Pt made aware of finding and agrees with plan.    5:01 PM Pt ambulate while maintaining 97% O2 on RA, no resp distress.  Pt felt better after receiving albuterol/atrovent treatment.  Pt has been on Zpak, steroid, cough medication PTA.  CXR without evidence of PNA.  I did give pt lasix here in ER.  I discussed care with Dr. Thurnell Garbe who felt pt will benefit from f/u with PCP but does not think pt will need diuretic medication at this time.    5:07 PM Chest CT scan is without evidence of PE.  She does have adenopathy and will likely need further evaluation including PET-CT to r/o malignancy.  Given her smoking hx, i discussed this with patient and strongly urge close f/u outpt.  Otherwise  pt stable for discharge.   Labs Review Labs Reviewed  BASIC METABOLIC PANEL - Abnormal; Notable for the following:    Glucose, Bld 167 (*)    All other components within normal limits  CBC - Abnormal; Notable for the following:    WBC 11.2 (*)    RBC 3.66 (*)    Hemoglobin 11.5 (*)    HCT 35.5 (*)    All other components within normal limits  D-DIMER, QUANTITATIVE - Abnormal; Notable for the following:    D-Dimer, Quant 0.83 (*)    All other components within normal limits  PRO B NATRIURETIC PEPTIDE - Abnormal; Notable for the following:    Pro B Natriuretic peptide (BNP) 495.1 (*)    All other components within normal limits  I-STAT TROPOININ, ED    Imaging Review Dg Chest 2 View (if Patient Has Fever And/or Copd)  05/30/2014   CLINICAL DATA:  Short of breath for 1 week.  History of COPD.  EXAM: CHEST  2 VIEW  COMPARISON:  10/29/2013.  FINDINGS: Normal heart size. Blunting of the right costophrenic angle is noted. There is mild interstitial edema. No airspace consolidation. The visualized osseous structures are unremarkable.  IMPRESSION: Suspect mild CHF.   Electronically Signed   By: Kerby Moors M.D.   On:  05/30/2014 12:53   Ct Angio Chest Pe W/cm &/or Wo Cm  05/30/2014   CLINICAL DATA:  Patient presents with shortness of breath and tightness in chest. Onset at 1 a.m. labored breathing upon arrival  EXAM: CT ANGIOGRAPHY CHEST WITH CONTRAST  TECHNIQUE: Multidetector CT imaging of the chest was performed using the standard protocol during bolus administration of intravenous contrast. Multiplanar CT image reconstructions and MIPs were obtained to evaluate the vascular anatomy.  CONTRAST:  163mL OMNIPAQUE IOHEXOL 350 MG/ML SOLN  COMPARISON:  03/03/2014  FINDINGS: Mediastinum: The heart size appears normal. There is no pericardial effusion. The trachea appears patent and is midline. The esophagus is on unremarkable. Enlarged mediastinal and hilar lymph nodes are again identified. Index right hilar lymph node measures 1.6 cm, image 154/series 5. This is compared with 1.1 cm previously. The sub- carinal lymph node measures 1.6 cm, image 125/ series 5. Previously 0.9 cm. The right hilar lymph node measures 1.5 cm, image 132/series 5. Previously 1.0 cm.  The main pulmonary artery appears patent. There is no lobar or segmental pulmonary artery filling defects to suggest acute pulmonary embolus.  Lungs/Pleura: No pleural effusion identified. There is mild interlobular septal thickening identified in both lung bases. 5 mm right lower lobe nodule is identified, image 43/series 6. Unchanged from previous exam.  Upper Abdomen: Incidental imaging through the upper abdomen is unremarkable.  Musculoskeletal: Review of the visualized osseous structures is negative for acute findings.  Review of the MIP images confirms the above findings.  IMPRESSION: 1. No evidence for acute pulmonary embolus. 2. Progressive mediastinal and bilateral hilar adenopathy. Differential considerations include granulomatous inflammation/infection including sarcoid. Metastatic adenopathy were lymphoma may also have this appearance. Consider further  evaluation with PET-CT and possible tissue sampling. 3. Stable pulmonary nodule in the right lung measuring 5 mm. In a patient that is at increased risk for lung cancer continued follow-up would be advised. At 6-12 months. This recommendation follows the consensus statement: Guidelines for Management of Small Pulmonary Nodules Detected on CT Scans: A Statement from the [employer] as published in Radiology 2005; 237:395-400.   Electronically Signed   By: Kerby Moors M.D.   On: 05/30/2014  17:03     EKG Interpretation   Date/Time:  Wednesday May 30 2014 12:27:07 EST Ventricular Rate:  75 PR Interval:  136 QRS Duration: 71 QT Interval:  385 QTC Calculation: 430 R Axis:   109 Text Interpretation:  Sinus rhythm Right axis deviation Baseline wander  When compared with ECG of 10/29/2013 Rightward axis is now Present  Otherwise no significant change Confirmed by [facility]  MD, Nunzio Cory  [phone]) on 05/30/2014 12:35:34 PM      MDM   Final diagnoses:  SOB (shortness of breath)  COPD exacerbation    BP 142/76 mmHg  Pulse 83  Temp(Src) 97.9 F (36.6 C) (Oral)  Resp 17  Ht 5\' 3"  (1.6 m)  Wt 200 lb (90.719 kg)  BMI 35.44 kg/m2  SpO2 95%  LMP 05/13/2014  I have reviewed nursing notes and vital signs. I personally reviewed the imaging tests through PACS system  I reviewed available ER/hospitalization records thought the EMR     Domenic Moras, PA-C 05/30/14 [place], DO 06/02/14 [id number]

## 2014-05-30 NOTE — ED Notes (Signed)
Patient transported to CT 

## 2014-05-30 NOTE — ED Notes (Signed)
Pt ambulated around the POD. No SOB noted. Pt O2 sats remained >96% rm air. Notified EDP.

## 2014-05-30 NOTE — Discharge Instructions (Signed)
Follow up with your doctor promptly for further management of your condition.  This is likely a COPD exacerbation.  You have some evidence to suggest that you have signs of congestive heart failure.  Your doctor will need to look into this, which may include cardiac echo procedure.  No evidence of lung infection or blood clot in your lung, however there are nodules in your lung that will need to be evaluate further with a PET-CT scan.  Discuss this with your doctor.  Return if you have any concerns.   Chronic Obstructive Pulmonary Disease Exacerbation  Chronic obstructive pulmonary disease (COPD) is a common lung problem. In COPD, the flow of air from the lungs is limited. COPD exacerbations are times that breathing gets worse and you need extra treatment. Without treatment they can be life threatening. If they happen often, your lungs can become more damaged. HOME CARE  Do not smoke.  Avoid tobacco smoke and other things that bother your lungs.  If given, take your antibiotic medicine as told. Finish the medicine even if you start to feel better.  Only take medicines as told by your doctor.  Drink enough fluids to keep your pee (urine) clear or pale yellow (unless your doctor has told you not to).  Use a cool mist machine (vaporizer).  If you use oxygen or a machine that turns liquid medicine into a mist (nebulizer), continue to use them as told.  Keep up with shots (vaccinations) as told by your doctor.  Exercise regularly.  Eat healthy foods.  Keep all doctor visits as told. GET HELP RIGHT AWAY IF:  You are very short of breath and it gets worse.  You have trouble talking.  You have bad chest pain.  You have blood in your spit (sputum).  You have a fever.  You keep throwing up (vomiting).  You feel weak, or you pass out (faint).  You feel confused.  You keep getting worse. MAKE SURE YOU:   Understand these instructions.  Will watch your condition.  Will get help  right away if you are not doing well or get worse. Document Released: 06/25/2011 Document Revised: 04/26/2013 Document Reviewed: 03/10/2013 [facility] Patient Information 2015 [place], [place]. This information is not intended to replace advice given to you by your health care provider. Make sure you discuss any questions you have with your health care provider.

## 2014-06-03 ENCOUNTER — Emergency Department (HOSPITAL_COMMUNITY)
Admission: EM | Admit: 2014-06-03 | Discharge: 2014-06-03 | Disposition: A | Discharge status: Home / Self Care | Payer: PRIVATE HEALTH INSURANCE | Attending: Emergency Medicine | Admitting: Emergency Medicine

## 2014-06-03 ENCOUNTER — Emergency Department (HOSPITAL_COMMUNITY): Payer: PRIVATE HEALTH INSURANCE

## 2014-06-03 ENCOUNTER — Encounter (HOSPITAL_COMMUNITY): Payer: Self-pay | Admitting: *Deleted

## 2014-06-03 DIAGNOSIS — J441 Chronic obstructive pulmonary disease with (acute) exacerbation: Secondary | ICD-10-CM | POA: Insufficient documentation

## 2014-06-03 DIAGNOSIS — Z79899 Other long term (current) drug therapy: Secondary | ICD-10-CM | POA: Insufficient documentation

## 2014-06-03 DIAGNOSIS — Z8739 Personal history of other diseases of the musculoskeletal system and connective tissue: Secondary | ICD-10-CM | POA: Insufficient documentation

## 2014-06-03 DIAGNOSIS — R911 Solitary pulmonary nodule: Secondary | ICD-10-CM | POA: Insufficient documentation

## 2014-06-03 DIAGNOSIS — R0602 Shortness of breath: Secondary | ICD-10-CM

## 2014-06-03 DIAGNOSIS — Z8701 Personal history of pneumonia (recurrent): Secondary | ICD-10-CM | POA: Insufficient documentation

## 2014-06-03 DIAGNOSIS — R918 Other nonspecific abnormal finding of lung field: Secondary | ICD-10-CM

## 2014-06-03 DIAGNOSIS — Z8669 Personal history of other diseases of the nervous system and sense organs: Secondary | ICD-10-CM | POA: Insufficient documentation

## 2014-06-03 DIAGNOSIS — K219 Gastro-esophageal reflux disease without esophagitis: Secondary | ICD-10-CM

## 2014-06-03 DIAGNOSIS — Z720 Tobacco use: Secondary | ICD-10-CM | POA: Insufficient documentation

## 2014-06-03 DIAGNOSIS — Z9851 Tubal ligation status: Secondary | ICD-10-CM | POA: Insufficient documentation

## 2014-06-03 DIAGNOSIS — R42 Dizziness and giddiness: Secondary | ICD-10-CM | POA: Insufficient documentation

## 2014-06-03 LAB — CBC
HCT: 41.9 % (ref 36.0–46.0)
Hemoglobin: 13.9 g/dL (ref 12.0–15.0)
MCH: 32 pg (ref 26.0–34.0)
MCHC: 33.2 g/dL (ref 30.0–36.0)
MCV: 96.3 fL (ref 78.0–100.0)
PLATELETS: 235 10*3/uL (ref 150–400)
RBC: 4.35 MIL/uL (ref 3.87–5.11)
RDW: 13.6 % (ref 11.5–15.5)
WBC: 12.1 10*3/uL — AB (ref 4.0–10.5)

## 2014-06-03 LAB — PRO B NATRIURETIC PEPTIDE: PRO B NATRI PEPTIDE: 165.1 pg/mL — AB (ref 0–125)

## 2014-06-03 LAB — TROPONIN I

## 2014-06-03 MED ORDER — OMEPRAZOLE 20 MG PO CPDR: 20.0000 mg | DELAYED_RELEASE_CAPSULE | Freq: Every day | ORAL | Status: DC

## 2014-06-03 NOTE — ED Notes (Signed)
Patient transported to X-ray 

## 2014-06-03 NOTE — Discharge Instructions (Signed)
Keep your appointment tomorrow as scheduled You have been given a copy of your CT Scan and written report as well a prescription  for Priloces to help control your "heart burn"

## 2014-06-03 NOTE — ED Notes (Signed)
Pt given copies of xrays and ct's to go home with for follow up

## 2014-06-03 NOTE — ED Provider Notes (Signed)
CSN: [id number]     Arrival date & time 06/03/14  2109 History   First MD Initiated Contact with Patient 06/03/14 2144     Chief Complaint  Patient presents with  . Chest Pain  . Dizziness     (Consider location/radiation/quality/duration/timing/severity/associated sxs/prior Treatment) HPI Comments: Patient seen 05/30/14 dx with mild CHF and questionable pulmonary nodules with recommendation for pulmonary follow up and PET scan   Patient reports that since discharge she has had intermittent "stabbling like needles" in her L chest worsening SOB.  Still has to sleep on a number of pillows,has decreased appetite,  and feels full after 1-2 bites of food,  tight feeling in abdomen and increase girth. States has an appointment with her PCP tomorrow but was afraid to stay at home due to the continued pain and worsening SOB.  Patient is a 43 y.o. female presenting with chest pain and dizziness. The history is provided by the patient.  Chest Pain Pain location:  Substernal area Pain quality: stabbing   Pain radiates to:  Does not radiate Pain radiates to the back: no   Pain severity:  Mild Onset quality:  Unable to specify Timing:  Intermittent Progression:  Worsening Chronicity:  Recurrent Context: at rest and stress   Context: not breathing, not eating, no intercourse, not lifting, no movement and not raising an arm   Relieved by:  None tried Worsened by:  Nothing tried Ineffective treatments:  None tried Associated symptoms: abdominal pain, anxiety, dizziness, lower extremity edema, orthopnea and shortness of breath   Associated symptoms: no claudication, no cough, no diaphoresis, no dysphagia, no fever, no headache, no heartburn, no nausea, no near-syncope, no numbness, no palpitations, no syncope, not vomiting and no weakness   Abdominal pain:    Location:  Generalized   Quality:  Dull   Severity:  Mild   Onset quality:  Unable to specify   Timing:  Intermittent   Progression:   Unchanged   Chronicity:  New Risk factors: smoking and surgery   Risk factors: not obese, not pregnant and no prior DVT/PE   Dizziness Quality:  Unable to specify Severity:  Mild Onset quality:  Unable to specify Timing:  Unable to specify Progression:  Unchanged Chronicity:  Recurrent Relieved by:  None tried Worsened by:  Nothing tried Associated symptoms: chest pain and shortness of breath   Associated symptoms: no blood in stool, no diarrhea, no headaches, no nausea, no palpitations, no syncope, no tinnitus, no vision changes, no vomiting and no weakness   Chest pain:    Quality:  Stabbing   Severity:  Moderate   Onset quality:  Unable to specify   Timing:  Intermittent   Chronicity:  Recurrent   Past Medical History  Diagnosis Date  . Arthritis   . Pneumonia   . GERD (gastroesophageal reflux disease)   . Headache(784.0)     Hx: of migraines  . COPD (chronic obstructive pulmonary disease)   . Carpal tunnel syndrome   . Trigger finger   . Plantar fasciitis    Past Surgical History  Procedure Laterality Date  . Cesarean section    . Colonoscopy  [id number]  . Diagnostic laparoscopy    . Adenoidectomy      Hx: of  . Tubal ligation    . Cholecystectomy N/A 02/01/2013    Procedure: LAPAROSCOPIC CHOLECYSTECTOMY WITH INTRAOPERATIVE CHOLANGIOGRAM;  Surgeon: Imogene Burn. Georgette Dover, MD;  Location: [place];  Service: General;  Laterality: N/A;  . Laparoscopic lysis of adhesions  N/A 02/01/2013    Procedure: LAPAROSCOPIC LYSIS OF ADHESIONS;  Surgeon: Imogene Burn. Georgette Dover, MD;  Location: [place] [facility];  Service: General;  Laterality: N/A;   Family History  Problem Relation Age of Onset  . Heart disease Mother   . Hypertension Mother   . Hyperlipidemia Mother   . Arthritis Mother   . Asthma Mother   . Cancer Father   . Hypertension Father   . Hyperlipidemia Father   . Hypertension Sister   . Hyperlipidemia Sister   . Hypertension Brother   . Hyperlipidemia Brother    History  Substance  Use Topics  . Smoking status: Current Some Day Smoker -- 1.50 packs/day    Types: Cigarettes  . Smokeless tobacco: Never Used  . Alcohol Use: No   OB History    No data available     Review of Systems  Constitutional: Positive for activity change and appetite change. Negative for fever, chills and diaphoresis.  HENT: Negative for tinnitus and trouble swallowing.   Respiratory: Positive for shortness of breath. Negative for cough, wheezing and stridor.   Cardiovascular: Positive for chest pain, orthopnea and leg swelling. Negative for palpitations, claudication, syncope and near-syncope.  Gastrointestinal: Positive for abdominal pain and abdominal distention. Negative for heartburn, nausea, vomiting, diarrhea, constipation and blood in stool.  Genitourinary: Negative for dysuria and frequency.  Skin: Negative for pallor and rash.  Neurological: Positive for dizziness. Negative for weakness, numbness and headaches.      Allergies  Lyrica and Gabapentin  Home Medications   Prior to Admission medications   Medication Sig Start Date End Date Taking? Authorizing Provider  albuterol (PROVENTIL HFA;VENTOLIN HFA) 108 (90 BASE) MCG/ACT inhaler Inhale 2 puffs into the lungs every 6 (six) hours as needed for wheezing or shortness of breath.   Yes Historical Provider, MD  albuterol (PROVENTIL) (2.5 MG/3ML) 0.083% nebulizer solution Take 2.5 mg by nebulization every 6 (six) hours as needed for wheezing or shortness of breath.   Yes Historical Provider, MD  azithromycin (ZITHROMAX) 250 MG tablet Take 250-500 mg by mouth daily. Start with 500mg  on day 1, then take 250mg  daily for the next 4 days; started on 11-9    Historical Provider, MD  omeprazole (PRILOSEC) 20 MG capsule Take 1 capsule (20 mg total) by mouth daily. 06/03/14   Garald Balding, NP  predniSONE (DELTASONE) 50 MG tablet Take 50 mg by mouth daily with breakfast. For 5 days; started on 11-9    Historical Provider, MD   BP 130/68 mmHg   Pulse 71  Temp(Src) 98.1 F (36.7 C) (Oral)  Resp 21  Ht 5\' 3"  (1.6 m)  Wt 212 lb (96.163 kg)  BMI 37.56 kg/m2  SpO2 96%  LMP 06/03/2014 Physical Exam  Constitutional: She is oriented to person, place, and time. She appears well-developed and well-nourished. No distress.  HENT:  Head: Normocephalic.  Mouth/Throat: Oropharynx is clear and moist.  Eyes: Pupils are equal, round, and reactive to light.  Neck: Normal range of motion.  Cardiovascular: Normal rate and regular rhythm.  Exam reveals no friction rub.   No murmur heard. Pulmonary/Chest: Effort normal and breath sounds normal. No respiratory distress. She has no wheezes. She has no rales.  Abdominal: Soft. Bowel sounds are normal. She exhibits distension. There is no tenderness.  Musculoskeletal: Normal range of motion. She exhibits edema. She exhibits no tenderness.  1 + per tibial edema   Neurological: She is alert and oriented to person, place, and time.  Skin:  Skin is warm. No rash noted. She is not diaphoretic. No erythema.  Nursing note and vitals reviewed.   ED Course  Procedures (including critical care time) Labs Review Labs Reviewed  CBC - Abnormal; Notable for the following:    WBC 12.1 (*)    All other components within normal limits  PRO B NATRIURETIC PEPTIDE - Abnormal; Notable for the following:    Pro B Natriuretic peptide (BNP) 165.1 (*)    All other components within normal limits  TROPONIN I    Imaging Review Dg Chest 2 View  06/03/2014   CLINICAL DATA:  Shortness of breath and chest pain.  Heavy smoker.  EXAM: CHEST  2 VIEW  COMPARISON:  05/30/2014  FINDINGS: The heart size and mediastinal contours are within normal limits. Both lungs are clear. The visualized skeletal structures are unremarkable.  IMPRESSION: No active cardiopulmonary disease.   Electronically Signed   By: Lucienne Capers M.D.   On: 06/03/2014 22:30     EKG Interpretation   Date/Time:  Sunday June 03 2014 21:21:49  EST Ventricular Rate:  68 PR Interval:  144 QRS Duration: 74 QT Interval:  406 QTC Calculation: 431 R Axis:   63 Text Interpretation:  Normal sinus rhythm Anterior infarct , age  undetermined Abnormal ECG No significant change was found Confirmed by  [facility]  MD, Lennette Bihari ([id number]) on 06/03/2014 10:39:18 PM      MDM  Patient chest xray and labs are improving  Patient reassured will treat "hart burn " with PPI and refer to [facility]  Patient has appointment with her PCP at [facility] and if they have pulmonary will FU with them.  I have give the patient a copy of her Ct Scan and written report as well as lab results Final diagnoses:  Pulmonary nodules  Gastroesophageal reflux disease, esophagitis presence not specified  Shortness of breath         Garald Balding, NP 06/03/14 [place], MD 06/06/14 [phone]

## 2014-06-03 NOTE — ED Notes (Signed)
Pt reports intermittent left side sharp/heavy non radiating chest pain. Pt reports some dizziness and lightheadedness with the chest pain, some shortness of breath. Pt states she was dx with CHF three days ago in the [facility].

## 2014-08-07 ENCOUNTER — Inpatient Hospital Stay (HOSPITAL_COMMUNITY)
Admission: EM | Admit: 2014-08-07 | Discharge: 2014-08-08 | DRG: 190 | Disposition: A | Discharge status: Home / Self Care | Payer: 59 | Attending: Internal Medicine | Admitting: Internal Medicine

## 2014-08-07 ENCOUNTER — Emergency Department (HOSPITAL_COMMUNITY): Payer: 59

## 2014-08-07 ENCOUNTER — Encounter (HOSPITAL_COMMUNITY): Payer: Self-pay | Admitting: Neurology

## 2014-08-07 DIAGNOSIS — J441 Chronic obstructive pulmonary disease with (acute) exacerbation: Secondary | ICD-10-CM | POA: Diagnosis present

## 2014-08-07 DIAGNOSIS — R911 Solitary pulmonary nodule: Secondary | ICD-10-CM | POA: Diagnosis present

## 2014-08-07 DIAGNOSIS — Z825 Family history of asthma and other chronic lower respiratory diseases: Secondary | ICD-10-CM

## 2014-08-07 DIAGNOSIS — R0789 Other chest pain: Secondary | ICD-10-CM | POA: Diagnosis present

## 2014-08-07 DIAGNOSIS — R0902 Hypoxemia: Secondary | ICD-10-CM

## 2014-08-07 DIAGNOSIS — Z8249 Family history of ischemic heart disease and other diseases of the circulatory system: Secondary | ICD-10-CM | POA: Diagnosis not present

## 2014-08-07 DIAGNOSIS — Z720 Tobacco use: Secondary | ICD-10-CM | POA: Diagnosis not present

## 2014-08-07 DIAGNOSIS — Z716 Tobacco abuse counseling: Secondary | ICD-10-CM | POA: Diagnosis present

## 2014-08-07 DIAGNOSIS — R0602 Shortness of breath: Secondary | ICD-10-CM | POA: Diagnosis present

## 2014-08-07 DIAGNOSIS — J9601 Acute respiratory failure with hypoxia: Secondary | ICD-10-CM | POA: Diagnosis present

## 2014-08-07 DIAGNOSIS — Z888 Allergy status to other drugs, medicaments and biological substances status: Secondary | ICD-10-CM | POA: Diagnosis not present

## 2014-08-07 HISTORY — DX: Bipolar disorder, unspecified: F31.9

## 2014-08-07 HISTORY — DX: Migraine, unspecified, not intractable, without status migrainosus: G43.909

## 2014-08-07 HISTORY — DX: Cardiac murmur, unspecified: R01.1

## 2014-08-07 LAB — CBC
HCT: 42.8 % (ref 36.0–46.0)
HEMOGLOBIN: 14.5 g/dL (ref 12.0–15.0)
MCH: 32.1 pg (ref 26.0–34.0)
MCHC: 33.9 g/dL (ref 30.0–36.0)
MCV: 94.7 fL (ref 78.0–100.0)
PLATELETS: 213 10*3/uL (ref 150–400)
RBC: 4.52 MIL/uL (ref 3.87–5.11)
RDW: 13.7 % (ref 11.5–15.5)
WBC: 9.9 10*3/uL (ref 4.0–10.5)

## 2014-08-07 LAB — BASIC METABOLIC PANEL
ANION GAP: 8 (ref 5–15)
BUN: 11 mg/dL (ref 6–23)
CALCIUM: 9.5 mg/dL (ref 8.4–10.5)
CO2: 30 mmol/L (ref 19–32)
CREATININE: 0.79 mg/dL (ref 0.50–1.10)
Chloride: 99 mEq/L (ref 96–112)
GFR calc Af Amer: >90 mL/min (ref >90)
GFR calc non Af Amer: >90 mL/min (ref >90)
Glucose, Bld: 101 mg/dL — ABNORMAL HIGH (ref 70–99)
Potassium: 4.5 mmol/L (ref 3.5–5.1)
SODIUM: 137 mmol/L (ref 135–145)

## 2014-08-07 LAB — I-STAT TROPONIN, ED: TROPONIN I, POC: 0 ng/mL (ref 0.00–0.08)

## 2014-08-07 LAB — BRAIN NATRIURETIC PEPTIDE: B NATRIURETIC PEPTIDE 5: 25 pg/mL (ref 0.0–100.0)

## 2014-08-07 MED ORDER — ALBUTEROL SULFATE (2.5 MG/3ML) 0.083% IN NEBU
5.0000 mg | INHALATION_SOLUTION | Freq: Once | RESPIRATORY_TRACT | Status: AC
  Administered 2014-08-07: 5 mg via RESPIRATORY_TRACT
  Filled 2014-08-07: qty 6

## 2014-08-07 MED ORDER — LEVOFLOXACIN 750 MG PO TABS: 750.0000 mg | ORAL_TABLET | Freq: Every day | ORAL | Status: DC

## 2014-08-07 MED ORDER — PREDNISONE 20 MG PO TABS: 40.0000 mg | ORAL_TABLET | Freq: Every day | ORAL | Status: DC

## 2014-08-07 MED ORDER — ALBUTEROL SULFATE HFA 108 (90 BASE) MCG/ACT IN AERS: 2.0000 | INHALATION_SPRAY | Freq: Four times a day (QID) | RESPIRATORY_TRACT | Status: DC | PRN

## 2014-08-07 MED ORDER — ALBUTEROL (5 MG/ML) CONTINUOUS INHALATION SOLN
10.0000 mg/h | INHALATION_SOLUTION | RESPIRATORY_TRACT | Status: DC
  Administered 2014-08-07: 10 mg/h via RESPIRATORY_TRACT
  Filled 2014-08-07: qty 20

## 2014-08-07 MED ORDER — HEPARIN SODIUM (PORCINE) 5000 UNIT/ML IJ SOLN
5000.0000 [IU] | Freq: Three times a day (TID) | INTRAMUSCULAR | Status: DC
  Administered 2014-08-07: 5000 [IU] via SUBCUTANEOUS
  Filled 2014-08-07 (×3): qty 1

## 2014-08-07 MED ORDER — ALBUTEROL SULFATE (2.5 MG/3ML) 0.083% IN NEBU: 5.0000 mg | INHALATION_SOLUTION | RESPIRATORY_TRACT | Status: AC | PRN

## 2014-08-07 MED ORDER — ALBUTEROL SULFATE (2.5 MG/3ML) 0.083% IN NEBU: 2.5000 mg | INHALATION_SOLUTION | Freq: Four times a day (QID) | RESPIRATORY_TRACT | Status: DC | PRN

## 2014-08-07 MED ORDER — PREDNISONE 50 MG PO TABS
50.0000 mg | ORAL_TABLET | Freq: Every day | ORAL | Status: DC
  Administered 2014-08-08: 50 mg via ORAL
  Filled 2014-08-07 (×2): qty 1

## 2014-08-07 MED ORDER — PREDNISONE 20 MG PO TABS
60.0000 mg | ORAL_TABLET | Freq: Once | ORAL | Status: AC
  Administered 2014-08-07: 60 mg via ORAL
  Filled 2014-08-07: qty 3

## 2014-08-07 MED ORDER — LEVOFLOXACIN IN D5W 750 MG/150ML IV SOLN
750.0000 mg | INTRAVENOUS | Status: DC
  Administered 2014-08-07: 750 mg via INTRAVENOUS
  Filled 2014-08-07 (×2): qty 150

## 2014-08-07 NOTE — ED Notes (Signed)
Pt reports sob for several days. Also chest pressure. Reports green sputum from cough. Denies n/v. Pt is a x 4.

## 2014-08-07 NOTE — Care Management (Addendum)
ED CM met with patient at bedside regardiing hospital admission due to [facility] concerns. Patient report being concerned about missing work due to just returning from [facility] leave.  Advised patient to contact her employer regarding FMLA . Explained the assessment and plan of care for admission, patient verbalized understanding teach back done, she is agreeable to inpatient admission. Dr. Tiana Loft PCP at [facility]/ Pulmonologist is at [facility] in [place] coverage, as per patient.

## 2014-08-07 NOTE — ED Notes (Signed)
resp called for continuous neb

## 2014-08-07 NOTE — ED Provider Notes (Signed)
CSN: [id number]     Arrival date & time 08/07/14  1446 History   First MD Initiated Contact with Patient 08/07/14 1517     Chief Complaint  Patient presents with  . Shortness of Breath     (Consider location/radiation/quality/duration/timing/severity/associated sxs/prior Treatment) The history is provided by the patient and medical records.    This is a 44 year old female with past medical history significant for arthritis, GERD, migraine headaches, COPD, carpal tunnel, presenting to the ED for shortness breath for the past week. Patient states she has had a productive cough with green sputum as well as some chest pressure. States she has had a noticeable wheeze, worse with exertional activities. She denies fever but has had some chills. She denies any abdominal pain, nausea, vomiting, or diarrhea. Her husband has also been sick with similar symptoms. She has been taking multiple over-the-counter cold medicines as well as her home albuterol without relief. Patient without known cardiac history. She is a daily smoker.  VSS on arrival. PCP-- [facility] in [place], [place].  Past Medical History  Diagnosis Date  . Arthritis   . Pneumonia   . GERD (gastroesophageal reflux disease)   . Headache(784.0)     Hx: of migraines  . COPD (chronic obstructive pulmonary disease)   . Carpal tunnel syndrome   . Trigger finger   . Plantar fasciitis    Past Surgical History  Procedure Laterality Date  . Cesarean section    . Colonoscopy  [id number]  . Diagnostic laparoscopy    . Adenoidectomy      Hx: of  . Tubal ligation    . Cholecystectomy N/A 02/01/2013    Procedure: LAPAROSCOPIC CHOLECYSTECTOMY WITH INTRAOPERATIVE CHOLANGIOGRAM;  Surgeon: Imogene Burn. Georgette Dover, MD;  Location: [facility];  Service: General;  Laterality: N/A;  . Laparoscopic lysis of adhesions N/A 02/01/2013    Procedure: LAPAROSCOPIC LYSIS OF ADHESIONS;  Surgeon: Imogene Burn. Georgette Dover, MD;  Location: [facility];  Service: General;  Laterality: N/A;    Family History  Problem Relation Age of Onset  . Heart disease Mother   . Hypertension Mother   . Hyperlipidemia Mother   . Arthritis Mother   . Asthma Mother   . Cancer Father   . Hypertension Father   . Hyperlipidemia Father   . Hypertension Sister   . Hyperlipidemia Sister   . Hypertension Brother   . Hyperlipidemia Brother    History  Substance Use Topics  . Smoking status: Current Some Day Smoker -- 1.50 packs/day    Types: Cigarettes  . Smokeless tobacco: Never Used  . Alcohol Use: No   OB History    No data available     Review of Systems  Constitutional: Positive for chills.  Respiratory: Positive for cough and shortness of breath.   Cardiovascular: Positive for chest pain.  All other systems reviewed and are negative.     Allergies  Lyrica and Gabapentin  Home Medications   Prior to Admission medications   Medication Sig Start Date End Date Taking? Authorizing Provider  albuterol (PROVENTIL HFA;VENTOLIN HFA) 108 (90 BASE) MCG/ACT inhaler Inhale 2 puffs into the lungs every 6 (six) hours as needed for wheezing or shortness of breath.    Historical Provider, MD  albuterol (PROVENTIL) (2.5 MG/3ML) 0.083% nebulizer solution Take 2.5 mg by nebulization every 6 (six) hours as needed for wheezing or shortness of breath.    Historical Provider, MD  azithromycin (ZITHROMAX) 250 MG tablet Take 250-500 mg by mouth daily. Start with 500mg   on day 1, then take 250mg  daily for the next 4 days; started on 11-9    Historical Provider, MD  omeprazole (PRILOSEC) 20 MG capsule Take 1 capsule (20 mg total) by mouth daily. 06/03/14   Garald Balding, NP  predniSONE (DELTASONE) 50 MG tablet Take 50 mg by mouth daily with breakfast. For 5 days; started on 11-9    Historical Provider, MD   BP 135/74 mmHg  Pulse 87  Temp(Src) 98.1 F (36.7 C) (Oral)  Resp 20  Ht 5\' 3"  (1.6 m)  Wt 217 lb (98.431 kg)  BMI 38.45 kg/m2  SpO2 93%  LMP 07/08/2014   Physical Exam   Constitutional: She is oriented to person, place, and time. She appears well-developed and well-nourished.  HENT:  Head: Normocephalic and atraumatic.  Mouth/Throat: Oropharynx is clear and moist.  Eyes: Conjunctivae and EOM are normal. Pupils are equal, round, and reactive to light.  Neck: Normal range of motion.  Cardiovascular: Normal rate, regular rhythm and normal heart sounds.   Pulmonary/Chest: Effort normal. She has wheezes. She has no rhonchi. She has no rales.  Respirations unlabored, expiratory wheezes throughout, no audible rhonchi or rales  Abdominal: Soft. Bowel sounds are normal.  Musculoskeletal: Normal range of motion.  Neurological: She is alert and oriented to person, place, and time.  Skin: Skin is warm and dry.  Psychiatric: She has a normal mood and affect.  Nursing note and vitals reviewed.   ED Course  Procedures (including critical care time)  CRITICAL CARE Performed by: Larene Pickett   Total critical care time: 40  Critical care time was exclusive of separately billable procedures and treating other patients.  Critical care was necessary to treat or prevent imminent or life-threatening deterioration.  Critical care was time spent personally by me on the following activities: development of treatment plan with patient and/or surrogate as well as nursing, discussions with consultants, evaluation of patient's response to treatment, examination of patient, obtaining history from patient or surrogate, ordering and performing treatments and interventions, ordering and review of laboratory studies, ordering and review of radiographic studies, pulse oximetry and re-evaluation of patient's condition.  Labs Review Labs Reviewed  BASIC METABOLIC PANEL - Abnormal; Notable for the following:    Glucose, Bld 101 (*)    All other components within normal limits  CBC  BRAIN NATRIURETIC PEPTIDE  I-STAT TROPOININ, ED    Imaging Review Dg Chest 2  View  08/07/2014   CLINICAL DATA:  Chest pain, cough for 2 weeks, history of pulmonary nodules  EXAM: CHEST  2 VIEW  COMPARISON:  06/03/2014  FINDINGS: Cardiomediastinal silhouette is stable. No acute infiltrate or pulmonary edema. Mild hyperinflation again noted. 5 mm nodule in right upper lobe is stable. Follow-up examination shows the performed as recommended on previous CT scan/PET scan  IMPRESSION: No active disease.  Stable 5 mm nodule in right upper lobe.   Electronically Signed   By: Lahoma Crocker M.D.   On: 08/07/2014 16:15     EKG Interpretation None      MDM   Final diagnoses:  Shortness of breath  COPD exacerbation  Hypoxia   44 year old female with shortness breath, productive cough, and chest pressure for the past week. Has been recently sick with similar symptoms. On exam, patient afebrile and nontoxic in appearance. She does have diffuse expiratory wheezes, but no acute respiratory distress.  Patient given albuterol neb with minimal improvement of her wheezing. She was started on an hour-long nebulizer treatment  and given dose of prednisone.  6:44 PM EKG reassuring. Troponin negative. Lab work unremarkable. Chest x-ray is clear. After completion of hour-long neb, went back into reassess patient. She was sitting in bed O2 sats dropped down to 86% on room air. Supplemental O2 started, quickly corrected back to 94%. At this time I have recommended hospital admission for COPD exacerbation and hypoxia.  Patient wants to discuss with her husband prior to making a decision.  7:19 PM Patient is adamant that she cannot be admitted.  States she has just returned back to work after having surgery and if she does not show up to work she will be fired.  States she carries the insurance for her family and cannot afford to miss work.  Patient advised against potential complications of leaving with low O2 sats including worsening condition, respiratory failure, and potentially death. She  acknowledges risk and still refuses admission. Patient will sign out against medical device. She was given prescriptions for antibiotics and prednisone.  7:33 PM Patient now states she wants to stay after talking with her husband again.  Case discussed with hospitalist, Dr. Alcario Drought, who will admit for further management.  Larene Pickett, PA-C 08/07/14 2003  Veryl Speak, MD 08/08/14 [phone]

## 2014-08-07 NOTE — H&P (Signed)
[facility] History and Physical  Crystal Fuentes NID:[id number] DOB: May 06, 1971 DOA: 08/07/2014  Referring physician: EDP PCP: No PCP Per Patient   Chief Complaint: SOB   HPI: Crystal Fuentes is a 44 y.o. female with h/o COPD, patient presents to the ED with worsening SOB for the past week.  Patient has had cough, productive of greenish sputum, as well as chest pressure.  Noticeable wheeze, worse with exertion.  No fever but has had some chills.  Husband has also been sick with similar symptoms.  Continues to smoke daily.  In ED even after hour long neb treatments patient O2 sat is only 86% on room air while sitting, improves to 90s on O2 via [place].  Patient was convinced to stay for admission though she is concerned about loosing her job due to missing work.  Review of Systems: Systems reviewed.  As above, otherwise negative  Past Medical History  Diagnosis Date  . Arthritis   . Pneumonia   . GERD (gastroesophageal reflux disease)   . Headache(784.0)     Hx: of migraines  . COPD (chronic obstructive pulmonary disease)   . Carpal tunnel syndrome   . Trigger finger   . Plantar fasciitis    Past Surgical History  Procedure Laterality Date  . Cesarean section    . Colonoscopy  [id number]  . Diagnostic laparoscopy    . Adenoidectomy      Hx: of  . Tubal ligation    . Cholecystectomy N/A 02/01/2013    Procedure: LAPAROSCOPIC CHOLECYSTECTOMY WITH INTRAOPERATIVE CHOLANGIOGRAM;  Surgeon: Imogene Burn. Georgette Dover, MD;  Location: [facility];  Service: General;  Laterality: N/A;  . Laparoscopic lysis of adhesions N/A 02/01/2013    Procedure: LAPAROSCOPIC LYSIS OF ADHESIONS;  Surgeon: Imogene Burn. Georgette Dover, MD;  Location: [facility];  Service: General;  Laterality: N/A;   Social History:  reports that she has been smoking Cigarettes.  She has been smoking about 1.50 packs per day. She has never used smokeless tobacco. She reports that she does not drink alcohol or use illicit drugs.  Allergies  Allergen Reactions  .  Lyrica [Pregabalin] Shortness Of Breath  . Gabapentin Other (See Comments)    shakiness    Family History  Problem Relation Age of Onset  . Heart disease Mother   . Hypertension Mother   . Hyperlipidemia Mother   . Arthritis Mother   . Asthma Mother   . Cancer Father   . Hypertension Father   . Hyperlipidemia Father   . Hypertension Sister   . Hyperlipidemia Sister   . Hypertension Brother   . Hyperlipidemia Brother      Prior to Admission medications   Medication Sig Start Date End Date Taking? Authorizing Provider  albuterol (PROVENTIL HFA;VENTOLIN HFA) 108 (90 BASE) MCG/ACT inhaler Inhale 2 puffs into the lungs every 6 (six) hours as needed for wheezing or shortness of breath.   Yes Historical Provider, MD  albuterol (PROVENTIL) (2.5 MG/3ML) 0.083% nebulizer solution Take 2.5 mg by nebulization every 6 (six) hours as needed for wheezing or shortness of breath.   Yes Historical Provider, MD   Physical Exam: Filed Vitals:   08/07/14 1929  BP: 128/55  Pulse: 104  Temp:   Resp: 16    BP 128/55 mmHg  Pulse 104  Temp(Src) 98.4 F (36.9 C) (Oral)  Resp 16  Ht 5\' 3"  (1.6 m)  Wt 98.431 kg (217 lb)  BMI 38.45 kg/m2  SpO2 94%  LMP 07/08/2014  General Appearance:  Alert, oriented, no distress, appears stated age  Head:    Normocephalic, atraumatic  Eyes:    PERRL, EOMI, sclera non-icteric        Nose:   Nares without drainage or epistaxis. Mucosa, turbinates normal  Throat:   Moist mucous membranes. Oropharynx without erythema or exudate.  Neck:   Supple. No carotid bruits.  No thyromegaly.  No lymphadenopathy.   Back:     No CVA tenderness, no spinal tenderness  Lungs:     Clear to auscultation bilaterally, without wheezes, rhonchi or rales  Chest wall:    No tenderness to palpitation  Heart:    Regular rate and rhythm without murmurs, gallops, rubs  Abdomen:     Soft, non-tender, nondistended, normal bowel sounds, no organomegaly  Genitalia:    deferred  Rectal:     deferred  Extremities:   No clubbing, cyanosis or edema.  Pulses:   2+ and symmetric all extremities  Skin:   Skin color, texture, turgor normal, no rashes or lesions  Lymph nodes:   Cervical, supraclavicular, and axillary nodes normal  Neurologic:   CNII-XII intact. Normal strength, sensation and reflexes      throughout    Labs on Admission:  Basic Metabolic Panel:  Recent Labs Lab 08/07/14 1505  NA 137  K 4.5  CL 99  CO2 30  GLUCOSE 101*  BUN 11  CREATININE 0.79  CALCIUM 9.5   Liver Function Tests: No results for input(s): AST, ALT, ALKPHOS, BILITOT, PROT, ALBUMIN in the last 168 hours. No results for input(s): LIPASE, AMYLASE in the last 168 hours. No results for input(s): AMMONIA in the last 168 hours. CBC:  Recent Labs Lab 08/07/14 1505  WBC 9.9  HGB 14.5  HCT 42.8  MCV 94.7  PLT 213   Cardiac Enzymes: No results for input(s): CKTOTAL, CKMB, CKMBINDEX, TROPONINI in the last 168 hours.  BNP (last 3 results)  Recent Labs  10/29/13 2156 05/30/14 1315 06/03/14 2129  PROBNP 177.3* 495.1* 165.1*   CBG: No results for input(s): GLUCAP in the last 168 hours.  Radiological Exams on Admission: Dg Chest 2 View  08/07/2014   CLINICAL DATA:  Chest pain, cough for 2 weeks, history of pulmonary nodules  EXAM: CHEST  2 VIEW  COMPARISON:  06/03/2014  FINDINGS: Cardiomediastinal silhouette is stable. No acute infiltrate or pulmonary edema. Mild hyperinflation again noted. 5 mm nodule in right upper lobe is stable. Follow-up examination shows the performed as recommended on previous CT scan/PET scan  IMPRESSION: No active disease.  Stable 5 mm nodule in right upper lobe.   Electronically Signed   By: Lahoma Crocker M.D.   On: 08/07/2014 16:15    EKG: Independently reviewed.  Assessment/Plan Principal Problem:   COPD exacerbation Active Problems:   Acute respiratory failure with hypoxia   1. COPD exacerbation causing acute respiratory failure with hypoxia - 1. New  oxygen requirement, patient satting 86% sitting on room air, improved with O2 via [place]. 2. Adult wheeze protocol for neb treatments 3. O2 via [place] 4. Levaquin (PCP had given her doxy) 5. Prednisone 2. FMLA concerns - case management is already seeing patient in ED to address this.    Code Status: Full Code  Family Communication: Husband at bedside Disposition Plan: Admit to inpatient   Time spent: 70 min  Cortni Tays M. [facility] Pager [phone]  If 7AM-7PM, please contact the day team taking care of the patient [email or web address] Password [facility] 08/07/2014, 8:23 PM

## 2014-08-07 NOTE — Progress Notes (Signed)
Unit CM UR Completed by [facility] ED CM  W. Taquita Demby RN  

## 2014-08-07 NOTE — ED Notes (Signed)
Patient reports that she has had a cough for the past 2 weeks . She has been seen 2 times by her family care. States she has been given doxy, albuterol, prednisone, tussionex pearls, hydrocodone syrup but continues to have a tight chest and cough. Pt also advises she continues to smoke cigarettes. Her mouth is very red and inflamed. Has small ulcerations.

## 2014-08-08 DIAGNOSIS — J441 Chronic obstructive pulmonary disease with (acute) exacerbation: Principal | ICD-10-CM

## 2014-08-08 DIAGNOSIS — R0789 Other chest pain: Secondary | ICD-10-CM

## 2014-08-08 DIAGNOSIS — Z720 Tobacco use: Secondary | ICD-10-CM

## 2014-08-08 DIAGNOSIS — J9601 Acute respiratory failure with hypoxia: Secondary | ICD-10-CM

## 2014-08-08 LAB — TROPONIN I: Troponin I: <0.03 ng/mL (ref <0.031)

## 2014-08-08 MED ORDER — TIOTROPIUM BROMIDE MONOHYDRATE 18 MCG IN CAPS: 18.0000 ug | ORAL_CAPSULE | Freq: Every day | RESPIRATORY_TRACT | Status: DC

## 2014-08-08 MED ORDER — LEVOFLOXACIN 750 MG PO TABS: 750.0000 mg | ORAL_TABLET | ORAL | Status: DC

## 2014-08-08 MED ORDER — PNEUMOCOCCAL VAC POLYVALENT 25 MCG/0.5ML IJ INJ
0.5000 mL | INJECTION | INTRAMUSCULAR | Status: AC
  Administered 2014-08-08: 0.5 mL via INTRAMUSCULAR
  Filled 2014-08-08: qty 0.5

## 2014-08-08 MED ORDER — PREDNISONE 50 MG PO TABS: 50.0000 mg | ORAL_TABLET | Freq: Every day | ORAL | Status: DC

## 2014-08-08 MED ORDER — TIOTROPIUM BROMIDE MONOHYDRATE 18 MCG IN CAPS
18.0000 ug | ORAL_CAPSULE | Freq: Every day | RESPIRATORY_TRACT | Status: DC
  Administered 2014-08-08: 18 ug via RESPIRATORY_TRACT
  Filled 2014-08-08: qty 5

## 2014-08-08 MED ORDER — LEVOFLOXACIN 750 MG PO TABS
750.0000 mg | ORAL_TABLET | ORAL | Status: DC
  Filled 2014-08-08: qty 1

## 2014-08-08 MED ORDER — CETYLPYRIDINIUM CHLORIDE 0.05 % MT LIQD
7.0000 mL | Freq: Two times a day (BID) | OROMUCOSAL | Status: DC
  Administered 2014-08-08: 7 mL via OROMUCOSAL

## 2014-08-08 NOTE — Progress Notes (Signed)
Bertis Ruddy to be D/C'd Home per MD order.  Discussed with the patient and all questions fully answered.    Medication List    TAKE these medications        albuterol 108 (90 BASE) MCG/ACT inhaler  Commonly known as:  PROVENTIL HFA;VENTOLIN HFA  Inhale 2 puffs into the lungs every 6 (six) hours as needed for wheezing or shortness of breath.     albuterol (2.5 MG/3ML) 0.083% nebulizer solution  Commonly known as:  PROVENTIL  Take 2.5 mg by nebulization every 6 (six) hours as needed for wheezing or shortness of breath.     levofloxacin 750 MG tablet  Commonly known as:  LEVAQUIN  Take 1 tablet (750 mg total) by mouth daily.     predniSONE 50 MG tablet  Commonly known as:  DELTASONE  Take 1 tablet (50 mg total) by mouth daily with breakfast.  Start taking on:  08/09/2014     tiotropium 18 MCG inhalation capsule  Commonly known as:  SPIRIVA  Place 1 capsule (18 mcg total) into inhaler and inhale daily.        VVS, Skin clean, dry and intact without evidence of skin break down, no evidence of skin tears noted. IV catheter discontinued intact. Site without signs and symptoms of complications. Dressing and pressure applied.  An After Visit Summary was printed and given to the patient.  D/c education completed with patient/family including follow up instructions, medication list, d/c activities limitations if indicated, with other d/c instructions as indicated by MD - patient able to verbalize understanding, all questions fully answered.   Patient instructed to return to ED, call 911, or call MD for any changes in condition.   Patient escorted via [place], and D/C home via private auto.  Delman Cheadle 08/08/2014 5:04 PM

## 2014-08-08 NOTE — Discharge Summary (Signed)
Physician Discharge Summary  Crystal Fuentes WLN:[id number] DOB: 05/17/1971 DOA: 08/07/2014  PCP: Crystal Ra, PA-C  Admit date: 08/07/2014 Discharge date: 08/08/2014  Recommendations for Outpatient Follow-up:  1. Pt will need to follow up with PCP in 2 weeks post discharge   Discharge Diagnoses:   acute respiratory failure with hypoxia -Improved with bronchodilators, prednisone, and levofloxacin -The patient was stable on room air at the time of discharge with oxygen saturation 93-94 percent COPD exacerbation -Patient improved clinically on oral prednisone -We'll give the patient four additional days of prednisone which will complete a 5 day burst therapy, 50 mg daily -Levofloxacin 750 mg for 4 additional days which will complete his therapy -ambulatory pulse ox did not show oxygen desaturation <88% Tobacco abuse  -Tobacco cessation discussed   atypical chest pain -Troponins negative 3 -EKG negative for any ST-T wave changes Dizziness -Orthostatic vital signs negative -Examination of tympanic membranes reveals bilateral scarred tympanic membranes with a minimal amount of fluid in the left tympanic membrane without any erythema -Patient states that she has followed up with a cardiologist whose workup including echocardiogram was unremarkable -Patient is to follow-up with primary care physician Pulmonary nodule -Chest x-ray revealed a stable 5 mm nodule -She will need outpatient surveillance  Discharge Condition: stable  Disposition:  Follow-up Information    Follow up with [facility].   Specialty:  Emergency Medicine   Why:  If symptoms worsen   Contact information:   [address] [id number] [place] [phone]      Diet:home Wt Readings from Last 3 Encounters:  08/07/14 95.573 kg (210 lb 11.2 oz)  06/03/14 96.163 kg (212 lb)  05/30/14 90.719 kg (200 lb)    History of present illness:    44 y.o. female with h/o COPD, patient presents to the ED with worsening SOB for the past week. Patient has had cough, productive of greenish sputum, as well as chest pressure. Noticeable wheeze, worse with exertion. No fever but has had some chills. Husband has also been sick with similar symptoms. Continues to smoke daily.  In ED even after hour long neb treatments patient O2 sat is only 86% on room air while sitting, improves to 90s on O2 via [place]. The patient was started on prednisone, levofloxacin, and bronchodilators. Her breathing improved. The patient was weaned to room air with oxygen saturation 93-94 percent. Ambulatory pulse oximetry did not reveal desaturation less than 88%.    Discharge Exam: Filed Vitals:   08/08/14 1623  BP: 142/73  Pulse: 96  Temp:   Resp:    Filed Vitals:   08/08/14 1335 08/08/14 1620 08/08/14 1622 08/08/14 1623  BP: 136/68 134/67 152/84 142/73  Pulse: 92 93 92 96  Temp: 98.3 F (36.8 C)     TempSrc: Oral     Resp: 20     Height:      Weight:      SpO2: 90% 90% 92% 90%   General: A&O x 3, NAD, pleasant, cooperative Cardiovascular: RRR, no rub, no gallop, no S3 Respiratory: diminished breath sounds bilaterally without any wheezing.  Abdomen:soft, nontender, nondistended, positive bowel sounds Extremities: No edema, No lymphangitis, no petechiae  Discharge Instructions     Medication List    TAKE these medications        albuterol 108 (90 BASE) MCG/ACT inhaler  Commonly known as:  PROVENTIL HFA;VENTOLIN HFA  Inhale 2 puffs into the lungs every 6 (six) hours as needed for wheezing  or shortness of breath.     albuterol (2.5 MG/3ML) 0.083% nebulizer solution  Commonly known as:  PROVENTIL  Take 2.5 mg by nebulization every 6 (six) hours as needed for wheezing or shortness of breath.     levofloxacin 750 MG tablet  Commonly known as:  LEVAQUIN  Take 1 tablet (750 mg total) by mouth daily.     predniSONE 50 MG tablet  Commonly known  as:  DELTASONE  Take 1 tablet (50 mg total) by mouth daily with breakfast.  Start taking on:  08/09/2014     tiotropium 18 MCG inhalation capsule  Commonly known as:  SPIRIVA  Place 1 capsule (18 mcg total) into inhaler and inhale daily.         The results of significant diagnostics from this hospitalization (including imaging, microbiology, ancillary and laboratory) are listed below for reference.    Significant Diagnostic Studies: Dg Chest 2 View  08/07/2014   CLINICAL DATA:  Chest pain, cough for 2 weeks, history of pulmonary nodules  EXAM: CHEST  2 VIEW  COMPARISON:  06/03/2014  FINDINGS: Cardiomediastinal silhouette is stable. No acute infiltrate or pulmonary edema. Mild hyperinflation again noted. 5 mm nodule in right upper lobe is stable. Follow-up examination shows the performed as recommended on previous CT scan/PET scan  IMPRESSION: No active disease.  Stable 5 mm nodule in right upper lobe.   Electronically Signed   By: Lahoma Crocker M.D.   On: 08/07/2014 16:15     Microbiology: No results found for this or any previous visit (from the past 240 hour(s)).   Labs: Basic Metabolic Panel:  Recent Labs Lab 08/07/14 1505  NA 137  K 4.5  CL 99  CO2 30  GLUCOSE 101*  BUN 11  CREATININE 0.79  CALCIUM 9.5   Liver Function Tests: No results for input(s): AST, ALT, ALKPHOS, BILITOT, PROT, ALBUMIN in the last 168 hours. No results for input(s): LIPASE, AMYLASE in the last 168 hours. No results for input(s): AMMONIA in the last 168 hours. CBC:  Recent Labs Lab 08/07/14 1505  WBC 9.9  HGB 14.5  HCT 42.8  MCV 94.7  PLT 213   Cardiac Enzymes:  Recent Labs Lab 08/08/14 1117 08/08/14 1641  TROPONINI <0.03 <0.03   BNP: Invalid input(s): POCBNP CBG: No results for input(s): GLUCAP in the last 168 hours.  Time coordinating discharge:  Greater than 30 minutes  Signed:  Jaysun Wessels, DO [facility] Pager: [phone] 08/08/2014, 6:44 PM

## 2014-08-08 NOTE — Progress Notes (Addendum)
Patient Saturations on Room Air at Rest = 94%  Patient Saturations on Room Air while Ambulating = 87-90% HR 110   12:00 PM Patient ambulated and Sp02 saturation on room air 90%.

## 2014-08-08 NOTE — Progress Notes (Signed)
Pt arrive to unit in no s/s of distress. Pt A&Ox4. Pt oriented to room and unit. VS stable. Continuous pulse ox set up. Whiteboard updated. Callbell within reach. Will continue to monitor. Husband at bedside. Report received from ED nurse prior to pt's arrival.

## 2015-07-21 ENCOUNTER — Encounter (HOSPITAL_COMMUNITY): Payer: Self-pay | Admitting: Emergency Medicine

## 2015-07-21 DIAGNOSIS — R0602 Shortness of breath: Secondary | ICD-10-CM | POA: Diagnosis present

## 2015-07-21 DIAGNOSIS — R011 Cardiac murmur, unspecified: Secondary | ICD-10-CM | POA: Insufficient documentation

## 2015-07-21 DIAGNOSIS — J449 Chronic obstructive pulmonary disease, unspecified: Secondary | ICD-10-CM | POA: Diagnosis not present

## 2015-07-21 DIAGNOSIS — F17210 Nicotine dependence, cigarettes, uncomplicated: Secondary | ICD-10-CM | POA: Diagnosis not present

## 2015-07-21 LAB — BASIC METABOLIC PANEL
ANION GAP: 8 (ref 5–15)
BUN: 15 mg/dL (ref 6–20)
CO2: 28 mmol/L (ref 22–32)
Calcium: 9.5 mg/dL (ref 8.9–10.3)
Chloride: 103 mmol/L (ref 101–111)
Creatinine, Ser: 0.71 mg/dL (ref 0.44–1.00)
GFR calc Af Amer: >60 mL/min (ref >60)
GFR calc non Af Amer: >60 mL/min (ref >60)
GLUCOSE: 98 mg/dL (ref 65–99)
POTASSIUM: 5 mmol/L (ref 3.5–5.1)
Sodium: 139 mmol/L (ref 135–145)

## 2015-07-21 LAB — CBC
HEMATOCRIT: 44.1 % (ref 36.0–46.0)
HEMOGLOBIN: 14.8 g/dL (ref 12.0–15.0)
MCH: 31.7 pg (ref 26.0–34.0)
MCHC: 33.6 g/dL (ref 30.0–36.0)
MCV: 94.4 fL (ref 78.0–100.0)
Platelets: 197 10*3/uL (ref 150–400)
RBC: 4.67 MIL/uL (ref 3.87–5.11)
RDW: 13.8 % (ref 11.5–15.5)
WBC: 5.9 10*3/uL (ref 4.0–10.5)

## 2015-07-21 MED ORDER — ALBUTEROL SULFATE (2.5 MG/3ML) 0.083% IN NEBU
INHALATION_SOLUTION | RESPIRATORY_TRACT | Status: DC
Start: 2015-07-21 — End: 2015-07-22
  Filled 2015-07-21: qty 6

## 2015-07-21 MED ORDER — ALBUTEROL SULFATE (2.5 MG/3ML) 0.083% IN NEBU
5.0000 mg | INHALATION_SOLUTION | Freq: Once | RESPIRATORY_TRACT | Status: AC
  Administered 2015-07-21: 5 mg via RESPIRATORY_TRACT

## 2015-07-21 NOTE — ED Notes (Signed)
Pt. reports productive cough with SOB and chest congestion onset last week , denies fever or chills .

## 2015-07-22 ENCOUNTER — Emergency Department (HOSPITAL_COMMUNITY)
Admission: EM | Admit: 2015-07-22 | Discharge: 2015-07-22 | Discharge status: Against Medical Advice | Payer: BLUE CROSS/BLUE SHIELD | Attending: Emergency Medicine | Admitting: Emergency Medicine

## 2015-07-22 DIAGNOSIS — Z5321 Procedure and treatment not carried out due to patient leaving prior to being seen by health care provider: Secondary | ICD-10-CM

## 2015-07-22 NOTE — ED Notes (Signed)
No answer in waiting area.

## 2015-07-22 NOTE — ED Notes (Signed)
No answer in waiting room 

## 2015-07-22 NOTE — ED Provider Notes (Signed)
Patient left without being seen  1. Patient left without being seen      Deno Etienne, DO 07/22/15 [phone]

## 2015-08-30 ENCOUNTER — Encounter (HOSPITAL_COMMUNITY): Payer: Self-pay | Admitting: *Deleted

## 2015-08-30 ENCOUNTER — Emergency Department (HOSPITAL_COMMUNITY): Payer: BLUE CROSS/BLUE SHIELD

## 2015-08-30 ENCOUNTER — Emergency Department (HOSPITAL_COMMUNITY)
Admission: EM | Admit: 2015-08-30 | Discharge: 2015-08-30 | Disposition: A | Discharge status: Home / Self Care | Payer: BLUE CROSS/BLUE SHIELD | Attending: Emergency Medicine | Admitting: Emergency Medicine

## 2015-08-30 DIAGNOSIS — R0789 Other chest pain: Secondary | ICD-10-CM

## 2015-08-30 DIAGNOSIS — J449 Chronic obstructive pulmonary disease, unspecified: Secondary | ICD-10-CM | POA: Insufficient documentation

## 2015-08-30 DIAGNOSIS — Z79899 Other long term (current) drug therapy: Secondary | ICD-10-CM | POA: Diagnosis not present

## 2015-08-30 DIAGNOSIS — Z8701 Personal history of pneumonia (recurrent): Secondary | ICD-10-CM | POA: Insufficient documentation

## 2015-08-30 DIAGNOSIS — R011 Cardiac murmur, unspecified: Secondary | ICD-10-CM | POA: Insufficient documentation

## 2015-08-30 DIAGNOSIS — F17210 Nicotine dependence, cigarettes, uncomplicated: Secondary | ICD-10-CM | POA: Insufficient documentation

## 2015-08-30 DIAGNOSIS — F319 Bipolar disorder, unspecified: Secondary | ICD-10-CM | POA: Insufficient documentation

## 2015-08-30 DIAGNOSIS — R079 Chest pain, unspecified: Secondary | ICD-10-CM | POA: Diagnosis present

## 2015-08-30 LAB — I-STAT TROPONIN, ED
TROPONIN I, POC: 0 ng/mL (ref 0.00–0.08)
TROPONIN I, POC: 0 ng/mL (ref 0.00–0.08)

## 2015-08-30 LAB — CBC
HCT: 40.1 % (ref 36.0–46.0)
Hemoglobin: 13.5 g/dL (ref 12.0–15.0)
MCH: 32 pg (ref 26.0–34.0)
MCHC: 33.7 g/dL (ref 30.0–36.0)
MCV: 95 fL (ref 78.0–100.0)
PLATELETS: 190 10*3/uL (ref 150–400)
RBC: 4.22 MIL/uL (ref 3.87–5.11)
RDW: 13.8 % (ref 11.5–15.5)
WBC: 5.1 10*3/uL (ref 4.0–10.5)

## 2015-08-30 LAB — BASIC METABOLIC PANEL
Anion gap: 8 (ref 5–15)
BUN: 12 mg/dL (ref 6–20)
CALCIUM: 9.3 mg/dL (ref 8.9–10.3)
CO2: 29 mmol/L (ref 22–32)
CREATININE: 0.79 mg/dL (ref 0.44–1.00)
Chloride: 103 mmol/L (ref 101–111)
GFR calc Af Amer: >60 mL/min (ref >60)
GLUCOSE: 100 mg/dL — AB (ref 65–99)
Potassium: 4.2 mmol/L (ref 3.5–5.1)
Sodium: 140 mmol/L (ref 135–145)

## 2015-08-30 MED ORDER — ASPIRIN EC 325 MG PO TBEC
325.0000 mg | DELAYED_RELEASE_TABLET | Freq: Once | ORAL | Status: AC
  Administered 2015-08-30: 325 mg via ORAL
  Filled 2015-08-30: qty 1

## 2015-08-30 MED ORDER — MORPHINE SULFATE (PF) 4 MG/ML IV SOLN
4.0000 mg | Freq: Once | INTRAVENOUS | Status: DC
  Filled 2015-08-30: qty 1

## 2015-08-30 MED ORDER — LORAZEPAM 2 MG/ML IJ SOLN
1.0000 mg | Freq: Once | INTRAMUSCULAR | Status: AC
  Administered 2015-08-30: 1 mg via INTRAVENOUS
  Filled 2015-08-30: qty 1

## 2015-08-30 NOTE — ED Provider Notes (Signed)
CSN: [id number]     Arrival date & time 08/30/15  0917 History   First MD Initiated Contact with Patient 08/30/15 [phone]     Chief Complaint  Patient presents with  . Chest Pain     (Consider location/radiation/quality/duration/timing/severity/associated sxs/prior Treatment) HPI   Crystal Fuentes is a 45 y.o. female with PMH significant for COPD, arthritis, bipolar disorder who presents with non-radiating, constant, pressure-like, left sided chest pain.  Patient reports she was sitting in the chair yesterday when she began experiencing left sided chest pain that radiated across her chest that lasted about 2-3 hours before subsiding.  She reports the pain returned approximately 1-2 hours ago (8 AM); however, this time it is non-radiating and located in her left chest.  No modifying factors.  Patient reports stress makes it worse.  No meds PTA. Denies personal cardiac history.  Family history of MI (sister and mother in their 42s or 55s).  No hx of DVT/PE or recent trauma/injury/surgery.  She reports she quit smoking 2 weeks ago. She reports increased stress in her life and increased anxiety concern marital problems and has recently started taking Wellbutrin; however, she has been forgetting to take it due to stress.    Past Medical History  Diagnosis Date  . Arthritis   . Pneumonia   . COPD (chronic obstructive pulmonary disease) ([place])   . Carpal tunnel syndrome   . Trigger finger   . Plantar fasciitis   . Heart murmur     "I was told I might have one"  . Migraine     Hx:  . Bipolar disorder [facility])    Past Surgical History  Procedure Laterality Date  . Cesarean section  1991  . Colonoscopy  [id number]  . Diagnostic laparoscopy    . Adenoidectomy  1970's  . Cholecystectomy N/A 02/01/2013    Procedure: LAPAROSCOPIC CHOLECYSTECTOMY WITH INTRAOPERATIVE CHOLANGIOGRAM;  Surgeon: Imogene Burn. Georgette Dover, MD;  Location: [place];  Service: General;  Laterality: N/A;  . Laparoscopic lysis of adhesions N/A  02/01/2013    Procedure: LAPAROSCOPIC LYSIS OF ADHESIONS;  Surgeon: Imogene Burn. Georgette Dover, MD;  Location: [place];  Service: General;  Laterality: N/A;  . Carpal tunnel release Right 03/2014  . Tubal ligation  1991   Family History  Problem Relation Age of Onset  . Heart disease Mother   . Hypertension Mother   . Hyperlipidemia Mother   . Arthritis Mother   . Asthma Mother   . Cancer Father   . Hypertension Father   . Hyperlipidemia Father   . Hypertension Sister   . Hyperlipidemia Sister   . Hypertension Brother   . Hyperlipidemia Brother    Social History  Substance Use Topics  . Smoking status: Current Every Day Smoker -- 2.00 packs/day for 32 years    Types: Cigarettes  . Smokeless tobacco: Never Used  . Alcohol Use: No   OB History    No data available     Review of Systems All other systems negative unless otherwise stated in HPI    Allergies  Lyrica and Gabapentin  Home Medications   Prior to Admission medications   Medication Sig Start Date End Date Taking? Authorizing Provider  albuterol (PROVENTIL HFA;VENTOLIN HFA) 108 (90 BASE) MCG/ACT inhaler Inhale 2 puffs into the lungs every 6 (six) hours as needed for wheezing or shortness of breath.    Historical Provider, MD  albuterol (PROVENTIL) (2.5 MG/3ML) 0.083% nebulizer solution Take 2.5 mg by nebulization every 6 (six) hours as  needed for wheezing or shortness of breath.    Historical Provider, MD  levofloxacin (LEVAQUIN) 750 MG tablet Take 1 tablet (750 mg total) by mouth daily. 08/08/14   Orson Eva, MD  predniSONE (DELTASONE) 50 MG tablet Take 1 tablet (50 mg total) by mouth daily with breakfast. 08/09/14   Orson Eva, MD  tiotropium (SPIRIVA) 18 MCG inhalation capsule Place 1 capsule (18 mcg total) into inhaler and inhale daily. 08/08/14   Orson Eva, MD   BP 132/77 mmHg  Pulse 89  Temp(Src) 98.7 F (37.1 C) (Oral)  Resp 17  Ht 5\' 3"  (1.6 m)  Wt 98.884 kg  BMI 38.63 kg/m2  SpO2 94% Physical Exam   Constitutional: She is oriented to person, place, and time. She appears well-developed and well-nourished.  Non-toxic appearance. She does not have a sickly appearance. She does not appear ill.  HENT:  Head: Normocephalic and atraumatic.  Mouth/Throat: Oropharynx is clear and moist.  Eyes: Conjunctivae are normal. Pupils are equal, round, and reactive to light.  Neck: Normal range of motion. Neck supple.  Cardiovascular: Normal rate, regular rhythm and normal heart sounds.   No murmur heard. Pulmonary/Chest: Effort normal and breath sounds normal. No accessory muscle usage or stridor. No respiratory distress. She has no wheezes. She has no rhonchi. She has no rales.  Abdominal: Soft. Bowel sounds are normal. She exhibits no distension. There is no tenderness.  Musculoskeletal: Normal range of motion.  Lymphadenopathy:    She has no cervical adenopathy.  Neurological: She is alert and oriented to person, place, and time.  Speech clear without dysarthria.  Skin: Skin is warm and dry.  Psychiatric: She has a normal mood and affect. Her behavior is normal.    ED Course  Procedures (including critical care time) Labs Review Labs Reviewed  BASIC METABOLIC PANEL - Abnormal; Notable for the following:    Glucose, Bld 100 (*)    All other components within normal limits  CBC  I-STAT TROPOININ, ED    Imaging Review Dg Chest 2 View  08/30/2015  CLINICAL DATA:  Cough. Mid chest and left chest pain starting last night. EXAM: CHEST  2 VIEW COMPARISON:  01/11/2015 FINDINGS: The heart size and mediastinal contours are within normal limits. Both lungs are clear. The visualized skeletal structures are unremarkable. IMPRESSION: No active cardiopulmonary disease. Electronically Signed   By: Van Clines M.D.   On: 08/30/2015 10:10   I have personally reviewed and evaluated these images and lab results as part of my medical decision-making.   EKG Interpretation   Date/Time:  Friday August 30 2015 09:29:46 EST Ventricular Rate:  82 PR Interval:  156 QRS Duration: 83 QT Interval:  367 QTC Calculation: 429 R Axis:   73 Text Interpretation:  Sinus rhythm Anterior infarct, old No significant  change since last tracing Confirmed by [facility]  MD, Loree Fee ([id number]) on  08/30/2015 10:14:07 AM      MDM   Final diagnoses:  Atypical chest pain    Patient with family hx of CAD, smoking (quit date 2 weeks ago), obesity with chest pain.  VSS, NAD.  No prior cardiac workup/history.  On exam, heart RRR, lungs CTAB, abdomen soft and nontender.  EKG shows NSR.  Will obtain CBC, BMP, troponin, and CXR.  Patient given 325 PO ASA.  HEART score 3.  CXR negative.  Troponin  x1-- 0.00.  CBC and BMP unremarkable.  Plan to delta troponin and outpatient cardiology follow up.   Case has  been discussed with Dr. Maryan Rued who agrees with the above plan for discharge.      Gloriann Loan, PA-C 08/30/15 1401  Blanchie Dessert, MD 08/30/15 1531

## 2015-08-30 NOTE — Discharge Instructions (Signed)
Nonspecific Chest Pain  °Chest pain can be caused by many different conditions. There is always a chance that your pain could be related to something serious, such as a heart attack or a blood clot in your lungs. Chest pain can also be caused by conditions that are not life-threatening. If you have chest pain, it is very important to follow up with your health care provider. °CAUSES  °Chest pain can be caused by: °· Heartburn. °· Pneumonia or bronchitis. °· Anxiety or stress. °· Inflammation around your heart (pericarditis) or lung (pleuritis or pleurisy). °· A blood clot in your lung. °· A collapsed lung (pneumothorax). It can develop suddenly on its own (spontaneous pneumothorax) or from trauma to the chest. °· Shingles infection (varicella-zoster virus). °· Heart attack. °· Damage to the bones, muscles, and cartilage that make up your chest wall. This can include: °¨ Bruised bones due to injury. °¨ Strained muscles or cartilage due to frequent or repeated coughing or overwork. °¨ Fracture to one or more ribs. °¨ Sore cartilage due to inflammation (costochondritis). °RISK FACTORS  °Risk factors for chest pain may include: °· Activities that increase your risk for trauma or injury to your chest. °· Respiratory infections or conditions that cause frequent coughing. °· Medical conditions or overeating that can cause heartburn. °· Heart disease or family history of heart disease. °· Conditions or health behaviors that increase your risk of developing a blood clot. °· Having had chicken pox (varicella zoster). °SIGNS AND SYMPTOMS °Chest pain can feel like: °· Burning or tingling on the surface of your chest or deep in your chest. °· Crushing, pressure, aching, or squeezing pain. °· Dull or sharp pain that is worse when you move, cough, or take a deep breath. °· Pain that is also felt in your back, neck, shoulder, or arm, or pain that spreads to any of these areas. °Your chest pain may come and go, or it may stay  constant. °DIAGNOSIS °Lab tests or other studies may be needed to find the cause of your pain. Your health care provider may have you take a test called an ambulatory ECG (electrocardiogram). An ECG records your heartbeat patterns at the time the test is performed. You may also have other tests, such as: °· Transthoracic echocardiogram (TTE). During echocardiography, sound waves are used to create a picture of all of the heart structures and to look at how blood flows through your heart. °· Transesophageal echocardiogram (TEE). This is a more advanced imaging test that obtains images from inside your body. It allows your health care provider to see your heart in finer detail. °· Cardiac monitoring. This allows your health care provider to monitor your heart rate and rhythm in real time. °· Holter monitor. This is a portable device that records your heartbeat and can help to diagnose abnormal heartbeats. It allows your health care provider to track your heart activity for several days, if needed. °· Stress tests. These can be done through exercise or by taking medicine that makes your heart beat more quickly. °· Blood tests. °· Imaging tests. °TREATMENT  °Your treatment depends on what is causing your chest pain. Treatment may include: °· Medicines. These may include: °¨ Acid blockers for heartburn. °¨ Anti-inflammatory medicine. °¨ Pain medicine for inflammatory conditions. °¨ Antibiotic medicine, if an infection is present. °¨ Medicines to dissolve blood clots. °¨ Medicines to treat coronary artery disease. °· Supportive care for conditions that do not require medicines. This may include: °¨ Resting. °¨ Applying heat   or cold packs to injured areas. °¨ Limiting activities until pain decreases. °HOME CARE INSTRUCTIONS °· If you were prescribed an antibiotic medicine, finish it all even if you start to feel better. °· Avoid any activities that bring on chest pain. °· Do not use any tobacco products, including  cigarettes, chewing tobacco, or electronic cigarettes. If you need help quitting, ask your health care provider. °· Do not drink alcohol. °· Take medicines only as directed by your health care provider. °· Keep all follow-up visits as directed by your health care provider. This is important. This includes any further testing if your chest pain does not go away. °· If heartburn is the cause for your chest pain, you may be told to keep your head raised (elevated) while sleeping. This reduces the chance that acid will go from your stomach into your esophagus. °· Make lifestyle changes as directed by your health care provider. These may include: °¨ Getting regular exercise. Ask your health care provider to suggest some activities that are safe for you. °¨ Eating a heart-healthy diet. A registered dietitian can help you to learn healthy eating options. °¨ Maintaining a healthy weight. °¨ Managing diabetes, if necessary. °¨ Reducing stress. °SEEK MEDICAL CARE IF: °· Your chest pain does not go away after treatment. °· You have a rash with blisters on your chest. °· You have a fever. °SEEK IMMEDIATE MEDICAL CARE IF:  °· Your chest pain is worse. °· You have an increasing cough, or you cough up blood. °· You have severe abdominal pain. °· You have severe weakness. °· You faint. °· You have chills. °· You have sudden, unexplained chest discomfort. °· You have sudden, unexplained discomfort in your arms, back, neck, or jaw. °· You have shortness of breath at any time. °· You suddenly start to sweat, or your skin gets clammy. °· You feel nauseous or you vomit. °· You suddenly feel light-headed or dizzy. °· Your heart begins to beat quickly, or it feels like it is skipping beats. °These symptoms may represent a serious problem that is an emergency. Do not wait to see if the symptoms will go away. Get medical help right away. Call your local emergency services (911 in the [place] Do not drive yourself to the hospital. °  °This  information is not intended to replace advice given to you by your health care provider. Make sure you discuss any questions you have with your health care provider. °  °Document Released: 04/15/2005 Document Revised: 07/27/2014 Document Reviewed: 02/09/2014 °Elsevier Interactive Patient Education ©2016 [employer] ° °

## 2015-08-30 NOTE — ED Notes (Signed)
Pt states L sided chest pain since last night that is now radiating across chest.  States recently been under a lot of stress and is taking welbutrin for anxiety, but has been forgetting to take it d/t stress.  States pain increases with crying.  States nausea "when my nerves start acting up".  States cough, but that it's chronic.

## 2015-11-15 ENCOUNTER — Encounter (HOSPITAL_COMMUNITY): Payer: Self-pay | Admitting: Emergency Medicine

## 2015-11-15 ENCOUNTER — Emergency Department (HOSPITAL_COMMUNITY)
Admission: EM | Admit: 2015-11-15 | Discharge: 2015-11-15 | Disposition: A | Discharge status: Home / Self Care | Payer: BLUE CROSS/BLUE SHIELD | Attending: Emergency Medicine | Admitting: Emergency Medicine

## 2015-11-15 DIAGNOSIS — R011 Cardiac murmur, unspecified: Secondary | ICD-10-CM | POA: Insufficient documentation

## 2015-11-15 DIAGNOSIS — Z8679 Personal history of other diseases of the circulatory system: Secondary | ICD-10-CM | POA: Diagnosis not present

## 2015-11-15 DIAGNOSIS — F32A Depression, unspecified: Secondary | ICD-10-CM

## 2015-11-15 DIAGNOSIS — F3130 Bipolar disorder, current episode depressed, mild or moderate severity, unspecified: Secondary | ICD-10-CM | POA: Insufficient documentation

## 2015-11-15 DIAGNOSIS — J449 Chronic obstructive pulmonary disease, unspecified: Secondary | ICD-10-CM | POA: Insufficient documentation

## 2015-11-15 DIAGNOSIS — Z79899 Other long term (current) drug therapy: Secondary | ICD-10-CM | POA: Insufficient documentation

## 2015-11-15 DIAGNOSIS — Z8669 Personal history of other diseases of the nervous system and sense organs: Secondary | ICD-10-CM | POA: Diagnosis not present

## 2015-11-15 DIAGNOSIS — F17210 Nicotine dependence, cigarettes, uncomplicated: Secondary | ICD-10-CM | POA: Diagnosis not present

## 2015-11-15 DIAGNOSIS — Z8701 Personal history of pneumonia (recurrent): Secondary | ICD-10-CM | POA: Insufficient documentation

## 2015-11-15 DIAGNOSIS — Z8739 Personal history of other diseases of the musculoskeletal system and connective tissue: Secondary | ICD-10-CM | POA: Diagnosis not present

## 2015-11-15 DIAGNOSIS — F329 Major depressive disorder, single episode, unspecified: Secondary | ICD-10-CM

## 2015-11-15 DIAGNOSIS — Z008 Encounter for other general examination: Secondary | ICD-10-CM | POA: Diagnosis present

## 2015-11-15 LAB — RAPID URINE DRUG SCREEN, HOSP PERFORMED
AMPHETAMINES: NOT DETECTED
Barbiturates: NOT DETECTED
Benzodiazepines: NOT DETECTED
Cocaine: NOT DETECTED
Opiates: NOT DETECTED
TETRAHYDROCANNABINOL: NOT DETECTED

## 2015-11-15 LAB — COMPREHENSIVE METABOLIC PANEL
ALT: 19 U/L (ref 14–54)
AST: 22 U/L (ref 15–41)
Albumin: 3.8 g/dL (ref 3.5–5.0)
Alkaline Phosphatase: 77 U/L (ref 38–126)
Anion gap: 10 (ref 5–15)
BUN: 8 mg/dL (ref 6–20)
CHLORIDE: 103 mmol/L (ref 101–111)
CO2: 26 mmol/L (ref 22–32)
CREATININE: 0.93 mg/dL (ref 0.44–1.00)
Calcium: 9.6 mg/dL (ref 8.9–10.3)
GFR calc Af Amer: >60 mL/min (ref >60)
Glucose, Bld: 122 mg/dL — ABNORMAL HIGH (ref 65–99)
Potassium: 4.2 mmol/L (ref 3.5–5.1)
SODIUM: 139 mmol/L (ref 135–145)
Total Bilirubin: 0.4 mg/dL (ref 0.3–1.2)
Total Protein: 7.2 g/dL (ref 6.5–8.1)

## 2015-11-15 LAB — ETHANOL: Alcohol, Ethyl (B): <5 mg/dL (ref <5)

## 2015-11-15 LAB — ACETAMINOPHEN LEVEL: Acetaminophen (Tylenol), Serum: <10 ug/mL — ABNORMAL LOW (ref 10–30)

## 2015-11-15 LAB — SALICYLATE LEVEL: Salicylate Lvl: <4 mg/dL (ref 2.8–30.0)

## 2015-11-15 LAB — CBC
HCT: 43.6 % (ref 36.0–46.0)
HEMOGLOBIN: 14.2 g/dL (ref 12.0–15.0)
MCH: 30.9 pg (ref 26.0–34.0)
MCHC: 32.6 g/dL (ref 30.0–36.0)
MCV: 94.8 fL (ref 78.0–100.0)
Platelets: 211 10*3/uL (ref 150–400)
RBC: 4.6 MIL/uL (ref 3.87–5.11)
RDW: 13.3 % (ref 11.5–15.5)
WBC: 8 10*3/uL (ref 4.0–10.5)

## 2015-11-15 NOTE — BH Assessment (Signed)
Tele Assessment Note   Crystal Fuentes is a 45 y.o. female with a history of depression who presented to [facility] with complaint of depressive symptoms and significant stress.  Pt was voluntary.  Pt reported as follows:  She stated that she had a history of depressive symptoms from her teenaged years that required inpatient treatment into her early 35s ("[place], Dororthea Dix"), and that the symptoms abated; she reported that she came to the hospital today because she feels overwhelmed by recent events -- namely, her health is declining (COPD), she is stressed by her bid to get worker's compensation (injured wrist), she and her husband are having marital conflict.  Pt denied feeling suicidal, and she endorsed feeling despondent, insomnia, over-eating, feeling loss of pleasure, guilt, fatigue.  Pt denied substance use concerns.  Pt stated that she feels she is experiencing a "nervous breakdown" but does not seek inpatient placement.  "I just want some medicine to help me feel better."  Pt reported that she has an outpatient therapist, but does not have a psychiatrist.  During assessment, Pt was gowned, and she was resting on her hospital bed.  Pt had good eye contact and she was cooperative.  Pt reported mood as depressed and affect was congruent.  Pt denied suicidal ideation; she endorsed other depressive symptoms (see above).  She also endorsed significant life stressors, including poor health and marital conflict.  Pt denied substance abuse concerns.  Pt's memory and concentration were intact.  Speech was normal in rate, rhythm, and volume.  Thought processes and thought content were within normal limits.  There was no evidence of delusion, and Pt denied auditory/visual hallucination.  Pt's insight, judgment, and impulse control were fair.    Consulted with Elmarie Shiley, NP, who determined that Pt does not meet inpatient criteria.  Recommended referral to [facility] outpatient.  Diagnosis: Major Depressive Disorder,  Recurrent Episode, Moderate  Past Medical History:  Past Medical History  Diagnosis Date  . Arthritis   . Pneumonia   . COPD (chronic obstructive pulmonary disease) ([place])   . Carpal tunnel syndrome   . Trigger finger   . Plantar fasciitis   . Heart murmur     "I was told I might have one"  . Migraine     Hx:  . Bipolar disorder [facility])     Past Surgical History  Procedure Laterality Date  . Cesarean section  1991  . Colonoscopy  [id number]  . Diagnostic laparoscopy    . Adenoidectomy  1970's  . Cholecystectomy N/A 02/01/2013    Procedure: LAPAROSCOPIC CHOLECYSTECTOMY WITH INTRAOPERATIVE CHOLANGIOGRAM;  Surgeon: Imogene Burn. Georgette Dover, MD;  Location: [place];  Service: General;  Laterality: N/A;  . Laparoscopic lysis of adhesions N/A 02/01/2013    Procedure: LAPAROSCOPIC LYSIS OF ADHESIONS;  Surgeon: Imogene Burn. Georgette Dover, MD;  Location: [place];  Service: General;  Laterality: N/A;  . Carpal tunnel release Right 03/2014  . Tubal ligation  1991    Family History:  Family History  Problem Relation Age of Onset  . Heart disease Mother   . Hypertension Mother   . Hyperlipidemia Mother   . Arthritis Mother   . Asthma Mother   . Cancer Father   . Hypertension Father   . Hyperlipidemia Father   . Hypertension Sister   . Hyperlipidemia Sister   . Hypertension Brother   . Hyperlipidemia Brother     Social History:  reports that she has been smoking Cigarettes.  She has a 64 pack-year smoking history.  She has never used smokeless tobacco. She reports that she does not drink alcohol or use illicit drugs.  Additional Social History:  Alcohol / Drug Use Pain Medications: See PTA Prescriptions: See PTA Over the Counter: See PTA History of alcohol / drug use?: No history of alcohol / drug abuse  CIWA: CIWA-Ar BP: 147/85 mmHg Pulse Rate: 114 COWS:    PATIENT STRENGTHS: (choose at least two) Ability for insight Average or above average intelligence Capable of independent  living Communication skills  Allergies:  Allergies  Allergen Reactions  . Chantix [Varenicline] Shortness Of Breath  . Lyrica [Pregabalin] Shortness Of Breath  . Gabapentin Other (See Comments)    shakiness    Home Medications:  (Not in a hospital admission)  OB/GYN Status:  No LMP recorded. Patient is not currently having periods (Reason: Perimenopausal).  General Assessment Data Location of Assessment: [facility] ED TTS Assessment: In system Is this a Tele or Face-to-Face Assessment?: Tele Assessment Is this an Initial Assessment or a Re-assessment for this encounter?: Initial Assessment Marital status: Married Is patient pregnant?: No Pregnancy Status: No Living Arrangements: Spouse/significant other Can pt return to current living arrangement?: Yes Admission Status: Voluntary Is patient capable of signing voluntary admission?: Yes Referral Source: MD  Medical Screening Exam ([place]) Medical Exam completed: Yes  Crisis Care Plan Living Arrangements: Spouse/significant other Name of Psychiatrist: None Name of Therapist: Conception Oms  Education Status Is patient currently in school?: No  Risk to self with the past 6 months Suicidal Ideation: No Has patient been a risk to self within the past 6 months prior to admission? : No Suicidal Intent: No Has patient had any suicidal intent within the past 6 months prior to admission? : No Is patient at risk for suicide?: No Suicidal Plan?: No Has patient had any suicidal plan within the past 6 months prior to admission? : No Access to Means: No What has been your use of drugs/alcohol within the last 12 months?: None Previous Attempts/Gestures: Yes How many times?: 1 ("My teenaged years ... not being wanted") Other Self Harm Risks: NA Triggers for Past Attempts: Family contact Intentional Self Injurious Behavior: None Family Suicide History: No Recent stressful life event(s): Conflict (Comment), Recent negative physical  changes (Unemployed; conflict with husband; declining health) Persecutory voices/beliefs?: No Depression: Yes Depression Symptoms: Despondent, Tearfulness, Insomnia, Guilt, Fatigue, Feeling worthless/self pity, Loss of interest in usual pleasures Substance abuse history and/or treatment for substance abuse?: No Suicide prevention information given to non-admitted patients: Not applicable  Risk to Others within the past 6 months Homicidal Ideation: No Does patient have any lifetime risk of violence toward others beyond the six months prior to admission? : No Thoughts of Harm to Others: No Current Homicidal Intent: No Current Homicidal Plan: No Access to Homicidal Means: No History of harm to others?: No Assessment of Violence: None Noted Does patient have access to weapons?: No Criminal Charges Pending?: No Does patient have a court date: No Is patient on probation?: No  Psychosis Hallucinations: None noted Delusions: None noted  Mental Status Report Appearance/Hygiene: In hospital gown Eye Contact: Good Motor Activity: Unremarkable Speech: Logical/coherent, Unremarkable Level of Consciousness: Alert Mood: Depressed Affect: Appropriate to circumstance Anxiety Level: None Thought Processes: Coherent, Relevant Judgement: Partial Orientation: Person, Place, Time, Situation Obsessive Compulsive Thoughts/Behaviors: None  Cognitive Functioning Concentration: Normal Memory: Recent Intact, Remote Intact IQ: Average Insight: Fair Impulse Control: Fair Appetite: Good Sleep: Decreased Vegetative Symptoms: None  ADLScreening [facility] Assessment Services) Patient's cognitive ability  adequate to safely complete daily activities?: Yes Patient able to express need for assistance with ADLs?: Yes Independently performs ADLs?: Yes (appropriate for developmental age)  Prior Inpatient Therapy Prior Inpatient Therapy: Yes Prior Therapy Dates: "Lots of years ago" Prior Therapy  Facilty/Provider(s): [facility] [place] Reason for Treatment: Depression  Prior Outpatient Therapy Prior Outpatient Therapy: Yes Prior Therapy Dates: Ongoing Prior Therapy Facilty/Provider([facility]): [facility] Reason for Treatment: Depression Does patient have an ACCT team?: No Does patient have Intensive In-House Services?  : No Does patient have [facility] services? : No Does patient have P4CC services?: No  ADL Screening (condition at time of admission) Patient's cognitive ability adequate to safely complete daily activities?: Yes Is the patient deaf or have difficulty hearing?: No Does the patient have difficulty seeing, even when wearing glasses/contacts?: No Does the patient have difficulty concentrating, remembering, or making decisions?: No Patient able to express need for assistance with ADLs?: Yes Does the patient have difficulty dressing or bathing?: No Independently performs ADLs?: Yes (appropriate for developmental age) Does the patient have difficulty walking or climbing stairs?: No Weakness of Legs: None Weakness of Arms/Hands: None       Abuse/Neglect Assessment (Assessment to be complete while patient is alone) Physical Abuse: Denies Verbal Abuse: Denies Sexual Abuse: Denies Exploitation of patient/patient's resources: Denies Self-Neglect: Denies Values / Beliefs Cultural Requests During Hospitalization: None Spiritual Requests During Hospitalization: None Consults Spiritual Care Consult Needed: No Social Work Consult Needed: No Regulatory affairs officer (For Healthcare) Does patient have an advance directive?: No Would patient like information on creating an advanced directive?: No - patient declined information    Additional Information 1:1 In Past 12 Months?: No CIRT Risk: No Elopement Risk: No Does patient have medical clearance?: Yes     Disposition:  Disposition Initial Assessment Completed for this Encounter:  Yes Disposition of Patient: Outpatient treatment (Per Elmarie Shiley, NP, Pt does not meet inpt criteria)  Laurena Slimmer Phillip Maffei 11/15/2015 5:05 PM

## 2015-11-15 NOTE — Discharge Instructions (Signed)
Major Depressive Disorder Major depressive disorder is a mental illness. It also may be called clinical depression or unipolar depression. Major depressive disorder usually causes feelings of sadness, hopelessness, or helplessness. Some people with this disorder do not feel particularly sad but lose interest in doing things they used to enjoy (anhedonia). Major depressive disorder also can cause physical symptoms. It can interfere with work, school, relationships, and other normal everyday activities. The disorder varies in severity but is longer lasting and more serious than the sadness we all feel from time to time in our lives. Major depressive disorder often is triggered by stressful life events or major life changes. Examples of these triggers include divorce, loss of your job or home, a move, and the death of a family member or close friend. Sometimes this disorder occurs for no obvious reason at all. People who have family members with major depressive disorder or bipolar disorder are at higher risk for developing this disorder, with or without life stressors. Major depressive disorder can occur at any age. It may occur just once in your life (single episode major depressive disorder). It may occur multiple times (recurrent major depressive disorder). SYMPTOMS People with major depressive disorder have either anhedonia or depressed mood on nearly a daily basis for at least 2 weeks or longer. Symptoms of depressed mood include:  Feelings of sadness (blue or down in the dumps) or emptiness.  Feelings of hopelessness or helplessness.  Tearfulness or episodes of crying (may be observed by others).  Irritability (children and adolescents). In addition to depressed mood or anhedonia or both, people with this disorder have at least four of the following symptoms:  Difficulty sleeping or sleeping too much.   Significant change (increase or decrease) in appetite or weight.   Lack of energy or  motivation.  Feelings of guilt and worthlessness.   Difficulty concentrating, remembering, or making decisions.  Unusually slow movement (psychomotor retardation) or restlessness (as observed by others).   Recurrent wishes for death, recurrent thoughts of self-harm (suicide), or a suicide attempt. People with major depressive disorder commonly have persistent negative thoughts about themselves, other people, and the world. People with severe major depressive disorder may experiencedistorted beliefs or perceptions about the world (psychotic delusions). They also may see or hear things that are not real (psychotic hallucinations). DIAGNOSIS Major depressive disorder is diagnosed through an assessment by your health care provider. Your health care provider will ask aboutaspects of your daily life, such as mood,sleep, and appetite, to see if you have the diagnostic symptoms of major depressive disorder. Your health care provider may ask about your medical history and use of alcohol or drugs, including prescription medicines. Your health care provider also may do a physical exam and blood work. This is because certain medical conditions and the use of certain substances can cause major depressive disorder-like symptoms (secondary depression). Your health care provider also may refer you to a mental health specialist for further evaluation and treatment. TREATMENT It is important to recognize the symptoms of major depressive disorder and seek treatment. The following treatments can be prescribed for this disorder:   Medicine. Antidepressant medicines usually are prescribed. Antidepressant medicines are thought to correct chemical imbalances in the brain that are commonly associated with major depressive disorder. Other types of medicine may be added if the symptoms do not respond to antidepressant medicines alone or if psychotic delusions or hallucinations occur.  Talk therapy. Talk therapy can be  helpful in treating major depressive disorder by providing   support, education, and guidance. Certain types of talk therapy also can help with negative thinking (cognitive behavioral therapy) and with relationship issues that trigger this disorder (interpersonal therapy). A mental health specialist can help determine which treatment is best for you. Most people with major depressive disorder do well with a combination of medicine and talk therapy. Treatments involving electrical stimulation of the brain can be used in situations with extremely severe symptoms or when medicine and talk therapy do not work over time. These treatments include electroconvulsive therapy, transcranial magnetic stimulation, and vagal nerve stimulation.   This information is not intended to replace advice given to you by your health care provider. Make sure you discuss any questions you have with your health care provider.   Document Released: 10/31/2012 Document Revised: 07/27/2014 Document Reviewed: 10/31/2012 Elsevier Interactive Patient Education 2016 [employer] Inc.  

## 2015-11-15 NOTE — ED Notes (Signed)
Pt states,"I feel like im having a nervous breakdown." pt states, "she has been arguing with her husband everyday and also she got into a argument with a room mate." Pt denies any SI or HI thoughts. Pt states she is just here to talk to someone because she needs "help".

## 2015-11-15 NOTE — ED Notes (Signed)
Pt discharged by PA, refused to wait for paperwork.

## 2015-11-15 NOTE — ED Provider Notes (Signed)
CSN: [id number]     Arrival date & time 11/15/15  1415 History   First MD Initiated Contact with Patient 11/15/15 1543     Chief Complaint  Patient presents with  . Psychiatric Evaluation     (Consider location/radiation/quality/duration/timing/severity/associated sxs/prior Treatment) Patient is a 45 y.o. female presenting with depression. The history is provided by the patient. No language interpreter was used.  Depression This is a new problem. The current episode started more than 1 month ago. The problem occurs constantly. The problem has been unchanged. Pertinent negatives include no abdominal pain. Nothing aggravates the symptoms. She has tried nothing for the symptoms. The treatment provided moderate relief.  Pt reports she has been fighting with her husband.  Pt reports her husband is an alcoholic.  Pt reports she has a lot of stress.  Pt is currently trying for disability.  She and husband are in marital counseling   Past Medical History  Diagnosis Date  . Arthritis   . Pneumonia   . COPD (chronic obstructive pulmonary disease) ([place])   . Carpal tunnel syndrome   . Trigger finger   . Plantar fasciitis   . Heart murmur     "I was told I might have one"  . Migraine     Hx:  . Bipolar disorder [facility])    Past Surgical History  Procedure Laterality Date  . Cesarean section  1991  . Colonoscopy  [id number]  . Diagnostic laparoscopy    . Adenoidectomy  1970's  . Cholecystectomy N/A 02/01/2013    Procedure: LAPAROSCOPIC CHOLECYSTECTOMY WITH INTRAOPERATIVE CHOLANGIOGRAM;  Surgeon: Imogene Burn. Georgette Dover, MD;  Location: [place];  Service: General;  Laterality: N/A;  . Laparoscopic lysis of adhesions N/A 02/01/2013    Procedure: LAPAROSCOPIC LYSIS OF ADHESIONS;  Surgeon: Imogene Burn. Georgette Dover, MD;  Location: [place];  Service: General;  Laterality: N/A;  . Carpal tunnel release Right 03/2014  . Tubal ligation  1991   Family History  Problem Relation Age of Onset  . Heart disease Mother   .  Hypertension Mother   . Hyperlipidemia Mother   . Arthritis Mother   . Asthma Mother   . Cancer Father   . Hypertension Father   . Hyperlipidemia Father   . Hypertension Sister   . Hyperlipidemia Sister   . Hypertension Brother   . Hyperlipidemia Brother    Social History  Substance Use Topics  . Smoking status: Current Every Day Smoker -- 2.00 packs/day for 32 years    Types: Cigarettes  . Smokeless tobacco: Never Used  . Alcohol Use: No   OB History    No data available     Review of Systems  Gastrointestinal: Negative for abdominal pain.  Psychiatric/Behavioral: Positive for depression.  All other systems reviewed and are negative.     Allergies  Chantix; Lyrica; and Gabapentin  Home Medications   Prior to Admission medications   Medication Sig Start Date End Date Taking? Authorizing Provider  albuterol (PROVENTIL HFA;VENTOLIN HFA) 108 (90 BASE) MCG/ACT inhaler Inhale 2 puffs into the lungs every 6 (six) hours as needed for wheezing or shortness of breath.   Yes Historical Provider, MD  albuterol (PROVENTIL) (2.5 MG/3ML) 0.083% nebulizer solution Take 2.5 mg by nebulization every 6 (six) hours as needed for wheezing or shortness of breath.   Yes Historical Provider, MD  busPIRone (BUSPAR) 5 MG tablet Take 5 mg by mouth 2 (two) times daily.   Yes Historical Provider, MD  fexofenadine (ALLEGRA) 30 MG tablet  Take 30 mg by mouth daily.   Yes Historical Provider, MD  omeprazole (PRILOSEC) 20 MG capsule Take 20 mg by mouth daily.   Yes Historical Provider, MD  tiotropium (SPIRIVA) 18 MCG inhalation capsule Place 1 capsule (18 mcg total) into inhaler and inhale daily. 08/08/14  Yes Orson Eva, MD  buPROPion [facility] SR) 150 MG 12 hr tablet Take 150 mg by mouth 2 (two) times daily.    Historical Provider, MD  levofloxacin (LEVAQUIN) 750 MG tablet Take 1 tablet (750 mg total) by mouth daily. Patient not taking: Reported on 08/30/2015 08/08/14   Orson Eva, MD  predniSONE  (DELTASONE) 50 MG tablet Take 1 tablet (50 mg total) by mouth daily with breakfast. Patient not taking: Reported on 08/30/2015 08/09/14   Orson Eva, MD   BP 147/85 mmHg  Pulse 114  Temp(Src) 98 F (36.7 C) (Oral)  Resp 16  Ht 5\' 6"  (1.676 m)  Wt 98.884 kg  BMI 35.20 kg/m2  SpO2 96% Physical Exam  Constitutional: She is oriented to person, place, and time. She appears well-developed and well-nourished.  HENT:  Head: Normocephalic and atraumatic.  Right Ear: External ear normal.  Eyes: Conjunctivae and EOM are normal. Pupils are equal, round, and reactive to light.  Neck: Normal range of motion.  Cardiovascular: Normal rate and normal heart sounds.   Pulmonary/Chest: Effort normal.  Abdominal: She exhibits no distension.  Musculoskeletal: Normal range of motion.  Neurological: She is alert and oriented to person, place, and time.  Skin: Skin is warm.  Psychiatric: She has a normal mood and affect.  Nursing note and vitals reviewed.   ED Course  Procedures (including critical care time) Labs Review Labs Reviewed  COMPREHENSIVE METABOLIC PANEL - Abnormal; Notable for the following:    Glucose, Bld 122 (*)    All other components within normal limits  ACETAMINOPHEN LEVEL - Abnormal; Notable for the following:    Acetaminophen (Tylenol), Serum <10 (*)    All other components within normal limits  ETHANOL  SALICYLATE LEVEL  CBC  URINE RAPID DRUG SCREEN, HOSP PERFORMED    Imaging Review No results found. I have personally reviewed and evaluated these images and lab results as part of my medical decision-making.   EKG Interpretation None      MDM  TTS spoke with pt and they agreed to outpatient treatment at [facility].     Final diagnoses:  Depression    Pt left without her discharge papers.   She was aware of follow up.     [place], PA-C 11/15/15 1729

## 2016-01-30 NOTE — ED Provider Notes (Signed)
Medical screening examination/treatment/procedure(s) were performed by non-physician practitioner and as supervising physician I was immediately available for consultation/collaboration.   EKG Interpretation None       Isla Pence, MD 01/30/16 1300

## 2016-08-28 ENCOUNTER — Encounter (HOSPITAL_COMMUNITY): Payer: Self-pay | Admitting: *Deleted

## 2016-08-28 ENCOUNTER — Emergency Department (HOSPITAL_COMMUNITY)
Admission: EM | Admit: 2016-08-28 | Discharge: 2016-08-28 | Disposition: A | Discharge status: Home / Self Care | Payer: 59 | Attending: Emergency Medicine | Admitting: Emergency Medicine

## 2016-08-28 DIAGNOSIS — Z79899 Other long term (current) drug therapy: Secondary | ICD-10-CM | POA: Insufficient documentation

## 2016-08-28 DIAGNOSIS — Z760 Encounter for issue of repeat prescription: Secondary | ICD-10-CM

## 2016-08-28 DIAGNOSIS — F17210 Nicotine dependence, cigarettes, uncomplicated: Secondary | ICD-10-CM | POA: Diagnosis not present

## 2016-08-28 DIAGNOSIS — M5442 Lumbago with sciatica, left side: Secondary | ICD-10-CM | POA: Diagnosis not present

## 2016-08-28 DIAGNOSIS — J449 Chronic obstructive pulmonary disease, unspecified: Secondary | ICD-10-CM | POA: Insufficient documentation

## 2016-08-28 DIAGNOSIS — M5432 Sciatica, left side: Secondary | ICD-10-CM

## 2016-08-28 DIAGNOSIS — M545 Low back pain: Secondary | ICD-10-CM | POA: Diagnosis present

## 2016-08-28 LAB — URINALYSIS, ROUTINE W REFLEX MICROSCOPIC
Bilirubin Urine: NEGATIVE
Glucose, UA: NEGATIVE mg/dL
Hgb urine dipstick: NEGATIVE
Ketones, ur: NEGATIVE mg/dL
Leukocytes, UA: NEGATIVE
Nitrite: NEGATIVE
Protein, ur: NEGATIVE mg/dL
Specific Gravity, Urine: 1.016 (ref 1.005–1.030)
pH: 7 (ref 5.0–8.0)

## 2016-08-28 MED ORDER — ALBUTEROL SULFATE (2.5 MG/3ML) 0.083% IN NEBU: 2.5000 mg | INHALATION_SOLUTION | Freq: Four times a day (QID) | RESPIRATORY_TRACT | 1 refills | Status: DC | PRN

## 2016-08-28 MED ORDER — TIOTROPIUM BROMIDE MONOHYDRATE 18 MCG IN CAPS: 18.0000 ug | ORAL_CAPSULE | Freq: Every day | RESPIRATORY_TRACT | 12 refills | Status: DC

## 2016-08-28 MED ORDER — DICLOFENAC SODIUM 50 MG PO TBEC: 50.0000 mg | DELAYED_RELEASE_TABLET | Freq: Two times a day (BID) | ORAL | 0 refills | Status: AC

## 2016-08-28 MED ORDER — CYCLOBENZAPRINE HCL 10 MG PO TABS: 10.0000 mg | ORAL_TABLET | Freq: Two times a day (BID) | ORAL | 0 refills | Status: AC | PRN

## 2016-08-28 MED ORDER — ALBUTEROL SULFATE HFA 108 (90 BASE) MCG/ACT IN AERS
2.0000 | INHALATION_SPRAY | RESPIRATORY_TRACT | Status: DC | PRN
  Administered 2016-08-28: 2 via RESPIRATORY_TRACT
  Filled 2016-08-28: qty 6.7

## 2016-08-28 NOTE — ED Triage Notes (Signed)
The pt is c/o pain in her lower back for one week.  She has chronic back pain.  She has difficulty having bms and urinary frequency  lmp  Months she thinks she has stopped

## 2016-08-28 NOTE — ED Provider Notes (Signed)
[place] DEPT Provider Note   CSN: [id number] Arrival date & time: 08/28/16  1646  By signing my name below, I, Gwenlyn Fudge, attest that this documentation has been prepared under the direction and in the presence of Debroah Baller, NP. Electronically Signed: Gwenlyn Fudge, ED Scribe. 08/28/16. 5:47 PM.  History   Chief Complaint Chief Complaint  Patient presents with  . Back Pain   The history is provided by the patient. No language interpreter was used.   HPI Comments: Crystal Fuentes is a 46 y.o. female with PMHx who presents to the Emergency Department complaining of chronic, constant, moderate lower back pain for 4 months. Pt reports pain is greater on the left side, exacerbated with prolonged standing and occasionally radiates to the upper left leg. She denies known injury to back. Pt has never had imaging or scans performed on back. She has tried Ibuprofen with no relief. She reports associated urinary frequency and constipation. She has smoked for 34 years and smokes approximately 1 pack/day Pt denies allergies. She is non-compliant with regular medication. She does not currently have a PCP. She denies nausea, vomiting, fever, chills, dysuria.  Pt has run out of Spiriva and Ventolin. She has not been able to check her blood sugar levels recently.   Past Medical History:  Diagnosis Date  . Arthritis   . Bipolar disorder ([place])   . Carpal tunnel syndrome   . COPD (chronic obstructive pulmonary disease) ([place])   . Heart murmur    "I was told I might have one"  . Migraine    Hx:  . Plantar fasciitis   . Pneumonia   . Trigger finger     Patient Active Problem List   Diagnosis Date Noted  . Tobacco abuse 08/08/2014  . Atypical chest pain 08/08/2014  . COPD exacerbation ([place]) 08/07/2014  . Acute respiratory failure with hypoxia ([place]) 08/07/2014  .   01/23/2013    Past Surgical History:  Procedure Laterality Date  . ADENOIDECTOMY  1970's  . CARPAL TUNNEL RELEASE Right 03/2014   . CESAREAN SECTION  1991  . CHOLECYSTECTOMY N/A 02/01/2013   Procedure: LAPAROSCOPIC CHOLECYSTECTOMY WITH INTRAOPERATIVE CHOLANGIOGRAM;  Surgeon: Imogene Burn. Georgette Dover, MD;  Location: [place];  Service: General;  Laterality: N/A;  . COLONOSCOPY  [id number]  . DIAGNOSTIC LAPAROSCOPY    . LAPAROSCOPIC LYSIS OF ADHESIONS N/A 02/01/2013   Procedure: LAPAROSCOPIC LYSIS OF ADHESIONS;  Surgeon: Imogene Burn. Georgette Dover, MD;  Location: [place];  Service: General;  Laterality: N/A;  . TUBAL LIGATION  1991    OB History    No data available       Home Medications    Prior to Admission medications   Medication Sig Start Date End Date Taking? Authorizing Provider  albuterol (PROVENTIL) (2.5 MG/3ML) 0.083% nebulizer solution Take 3 mLs (2.5 mg total) by nebulization every 6 (six) hours as needed for wheezing or shortness of breath. 08/28/16   Asli Tokarski Bunnie Pion, NP  buPROPion (WELLBUTRIN SR) 150 MG 12 hr tablet Take 150 mg by mouth 2 (two) times daily.    Historical Provider, MD  busPIRone (BUSPAR) 5 MG tablet Take 5 mg by mouth 2 (two) times daily.    Historical Provider, MD  cyclobenzaprine (FLEXERIL) 10 MG tablet Take 1 tablet (10 mg total) by mouth 2 (two) times daily as needed for muscle spasms. 08/28/16   Marnie Fazzino Bunnie Pion, NP  diclofenac (VOLTAREN) 50 MG EC tablet Take 1 tablet (50 mg total) by mouth 2 (two) times daily.  08/28/16   Saranne Crislip Bunnie Pion, NP  fexofenadine (ALLEGRA) 30 MG tablet Take 30 mg by mouth daily.    Historical Provider, MD  omeprazole (PRILOSEC) 20 MG capsule Take 20 mg by mouth daily.    Historical Provider, MD  tiotropium (SPIRIVA HANDIHALER) 18 MCG inhalation capsule Place 1 capsule (18 mcg total) into inhaler and inhale daily. 08/28/16   Chibuike Fleek Bunnie Pion, NP    Family History Family History  Problem Relation Age of Onset  . Heart disease Mother   . Hypertension Mother   . Hyperlipidemia Mother   . Arthritis Mother   . Asthma Mother   . Cancer Father   . Hypertension Father   . Hyperlipidemia Father   .  Hypertension Sister   . Hyperlipidemia Sister   . Hypertension Brother   . Hyperlipidemia Brother     Social History Social History  Substance Use Topics  . Smoking status: Current Every Day Smoker    Packs/day: 2.00    Years: 32.00    Types: Cigarettes  . Smokeless tobacco: Never Used  . Alcohol use No     Allergies   Chantix [varenicline]; Lyrica [pregabalin]; and Gabapentin   Review of Systems Review of Systems  Constitutional: Negative for chills and fever.  Gastrointestinal: Positive for constipation. Negative for nausea and vomiting.  Genitourinary: Positive for frequency. Negative for difficulty urinating, dysuria, flank pain and pelvic pain.  Musculoskeletal: Positive for back pain.  Skin: Negative for rash.  Neurological: Negative for syncope.   Physical Exam Updated Vital Signs BP 129/70 (BP Location: Left Arm)   Pulse 77   Temp 98.2 F (36.8 C)   Resp 16   Ht 5\' 3"  (1.6 m)   Wt 103.6 kg   SpO2 100%   BMI 40.45 kg/m   Physical Exam  Constitutional: She is oriented to person, place, and time. She appears well-developed and well-nourished. She is active. No distress.  HENT:  Head: Normocephalic and atraumatic.  Eyes: Conjunctivae and EOM are normal.  Neck: Neck supple.  Cardiovascular: Normal rate and regular rhythm.   Radial pulses are 2+ Adequate circulation  Pulmonary/Chest: Effort normal. Wheezes: occasional. She has no rales.  slightly decreased breath sounds  Abdominal: Soft. There is no tenderness.  Musculoskeletal:       Lumbar back: She exhibits tenderness, pain and spasm. She exhibits normal pulse.  Muscles spasm on the  Left lumbar area that radiates to the left sciatic nerve No calf pain  Neurological: She is alert and oriented to person, place, and time. She has normal strength. No sensory deficit. Gait normal.  Straight leg raises without difficulty Reflexes are equal Grips are equal Ambulatory with steady gait, no foot drag    Skin: Skin is warm and dry.  Psychiatric: She has a normal mood and affect. Her behavior is normal.  Nursing note and vitals reviewed.   ED Treatments / Results  DIAGNOSTIC STUDIES: Oxygen Saturation is 95% on RA, adequate by my interpretation.    COORDINATION OF CARE: 5:41 PM Discussed treatment plan with pt at bedside which includes muscle relaxer and prescription refill and pt agreed to plan.  Labs (all labs ordered are listed, but only abnormal results are displayed) Labs Reviewed  URINALYSIS, [place]   Radiology No results found.  Procedures Procedures (including critical care time)  Medications Ordered in ED Medications - No data to display   Initial Impression / Assessment and Plan / ED Course  I have reviewed the  triage vital signs and the nursing notes.  I personally performed the services described in this documentation, which was scribed in my presence. The recorded information has been reviewed and is accurate.   Patient with back pain.  No neurological deficits and normal neuro exam.  Patient is ambulatory.  No loss of bowel or bladder control.  No concern for cauda equina.  No fever, night sweats, weight loss, h/o cancer, IVDA, no recent procedure to back. Normal urinalysis.  Supportive care and return precaution discussed. Appears safe for discharge at this time. Follow up as indicated in discharge paperwork.   Pt here for refill of medication for COPD. Medication is not a controlled substance. Will refill medication here. Discussed need to follow up with PCP in 2-3 days.  Pt is safe for discharge at this time.  Final Clinical Impressions(s) / ED Diagnoses   Final diagnoses:  Sciatica, left side  Medication refill    New Prescriptions Discharge Medication List as of 08/28/2016  5:53 PM    START taking these medications   Details  cyclobenzaprine (FLEXERIL) 10 MG tablet Take 1 tablet (10 mg total) by mouth 2 (two) times daily as  needed for muscle spasms., Starting Fri 08/28/2016, Print    diclofenac (VOLTAREN) 50 MG EC tablet Take 1 tablet (50 mg total) by mouth 2 (two) times daily., Starting Fri 08/28/2016, [place], NP 08/29/16 [place], MD 08/30/16 [phone]

## 2016-08-28 NOTE — ED Notes (Signed)
Pt states he understands instructions. Home Stable with steady gait.  

## 2016-08-28 NOTE — Discharge Instructions (Signed)
Follow up with your doctor as soon as possible. Return here as needed.

## 2016-08-28 NOTE — ED Notes (Signed)
Also c/o constipation.

## 2017-05-19 ENCOUNTER — Emergency Department (HOSPITAL_COMMUNITY): Payer: BLUE CROSS/BLUE SHIELD

## 2017-05-19 ENCOUNTER — Emergency Department (HOSPITAL_COMMUNITY)
Admission: EM | Admit: 2017-05-19 | Discharge: 2017-05-19 | Disposition: A | Discharge status: Home / Self Care | Payer: BLUE CROSS/BLUE SHIELD | Attending: Emergency Medicine | Admitting: Emergency Medicine

## 2017-05-19 ENCOUNTER — Encounter (HOSPITAL_COMMUNITY): Payer: Self-pay | Admitting: Emergency Medicine

## 2017-05-19 DIAGNOSIS — M545 Low back pain, unspecified: Secondary | ICD-10-CM

## 2017-05-19 DIAGNOSIS — W010XXA Fall on same level from slipping, tripping and stumbling without subsequent striking against object, initial encounter: Secondary | ICD-10-CM | POA: Diagnosis not present

## 2017-05-19 DIAGNOSIS — J449 Chronic obstructive pulmonary disease, unspecified: Secondary | ICD-10-CM | POA: Insufficient documentation

## 2017-05-19 DIAGNOSIS — W19XXXA Unspecified fall, initial encounter: Secondary | ICD-10-CM

## 2017-05-19 DIAGNOSIS — Z79899 Other long term (current) drug therapy: Secondary | ICD-10-CM | POA: Insufficient documentation

## 2017-05-19 DIAGNOSIS — F17210 Nicotine dependence, cigarettes, uncomplicated: Secondary | ICD-10-CM | POA: Diagnosis not present

## 2017-05-19 DIAGNOSIS — M25562 Pain in left knee: Secondary | ICD-10-CM | POA: Insufficient documentation

## 2017-05-19 DIAGNOSIS — M25561 Pain in right knee: Secondary | ICD-10-CM | POA: Diagnosis not present

## 2017-05-19 MED ORDER — NAPROXEN 250 MG PO TABS
500.0000 mg | ORAL_TABLET | Freq: Once | ORAL | Status: AC
  Administered 2017-05-19: 500 mg via ORAL
  Filled 2017-05-19: qty 2

## 2017-05-19 MED ORDER — NAPROXEN 500 MG PO TABS: 500.0000 mg | ORAL_TABLET | Freq: Two times a day (BID) | ORAL | 0 refills | Status: AC

## 2017-05-19 NOTE — ED Notes (Signed)
Patient verbalized understanding of discharge instructions and denies any further needs or questions at this time. VS stable. Patient ambulatory with steady gait. Escorted to ED entrance in wheelchair.   

## 2017-05-19 NOTE — Discharge Instructions (Signed)
Take Naprosyn twice daily with food.  Do not take ibuprofen, Aleve, Motrin, or Midol  with this medication, you may take Tylenol.  Apply ice or heat to the affected areas for comfort, which ever feels best.  Elevate the knees when you are not walking.  Follow-up with your primary care physician or orthopedic physician if symptoms persist.  Return to the ED for any concerning signs or symptoms develop.

## 2017-05-19 NOTE — ED Provider Notes (Signed)
[facility] EMERGENCY DEPARTMENT Provider Note   CSN: [id number] Arrival date & time: 05/19/17  2134     History   Chief Complaint Chief Complaint  Patient presents with  . Knee Pain    HPI Crystal Fuentes is a 46 y.o. female with history of COPD, carpal tunnel, bipolar disorder, plantar fasciitis, and arthritis who presents today with chief complaint acute onset, constant bilateral knee pain secondary to fall yesterday.  She states that she tripped over a large hose landing on both knees, denies head injury or loss of consciousness.  She is able to bear weight but it is painful.  Pain in the knees is  described as throbbing and at times sharp. pain at times radiates to bilateral hips.  She also endorses mild aching low back pain which does not radiate.  Denies bowel or bladder incontinence, saddle anesthesia, numbness, tingling, or weakness.  Has not tried anything for her symptoms.  The history is provided by the patient.    Past Medical History:  Diagnosis Date  . Arthritis   . Bipolar disorder ([place])   . Carpal tunnel syndrome   . COPD (chronic obstructive pulmonary disease) ([place])   . Heart murmur    "I was told I might have one"  . Migraine    Hx:  . Plantar fasciitis   . Pneumonia   . Trigger finger     Patient Active Problem List   Diagnosis Date Noted  . Tobacco abuse 08/08/2014  . Atypical chest pain 08/08/2014  . COPD exacerbation ([place]) 08/07/2014  . Acute respiratory failure with hypoxia ([place]) 08/07/2014  .   01/23/2013    Past Surgical History:  Procedure Laterality Date  . ADENOIDECTOMY  1970's  . CARPAL TUNNEL RELEASE Right 03/2014  . CESAREAN SECTION  1991  . CHOLECYSTECTOMY N/A 02/01/2013   Procedure: LAPAROSCOPIC CHOLECYSTECTOMY WITH INTRAOPERATIVE CHOLANGIOGRAM;  Surgeon: Imogene Burn. Georgette Dover, MD;  Location: [facility];  Service: General;  Laterality: N/A;  . COLONOSCOPY  [id number]  . DIAGNOSTIC LAPAROSCOPY    . LAPAROSCOPIC LYSIS OF ADHESIONS  N/A 02/01/2013   Procedure: LAPAROSCOPIC LYSIS OF ADHESIONS;  Surgeon: Imogene Burn. Georgette Dover, MD;  Location: [facility];  Service: General;  Laterality: N/A;  . TUBAL LIGATION  1991    OB History    No data available       Home Medications    Prior to Admission medications   Medication Sig Start Date End Date Taking? Authorizing Provider  albuterol (PROVENTIL) (2.5 MG/3ML) 0.083% nebulizer solution Take 3 mLs (2.5 mg total) by nebulization every 6 (six) hours as needed for wheezing or shortness of breath. 08/28/16   Ashley Murrain, NP  buPROPion [facility] SR) 150 MG 12 hr tablet Take 150 mg by mouth 2 (two) times daily.    [provider]  busPIRone (BUSPAR) 5 MG tablet Take 5 mg by mouth 2 (two) times daily.    [provider]  cyclobenzaprine (FLEXERIL) 10 MG tablet Take 1 tablet (10 mg total) by mouth 2 (two) times daily as needed for muscle spasms. 08/28/16   Ashley Murrain, NP  diclofenac (VOLTAREN) 50 MG EC tablet Take 1 tablet (50 mg total) by mouth 2 (two) times daily. 08/28/16   Ashley Murrain, NP  fexofenadine (ALLEGRA) 30 MG tablet Take 30 mg by mouth daily.    [provider]  naproxen (NAPROSYN) 500 MG tablet Take 1 tablet (500 mg total) by mouth 2 (two) times daily with a  meal. 05/19/17   Nara Paternoster A, PA-C  omeprazole (PRILOSEC) 20 MG capsule Take 20 mg by mouth daily.    [provider]  tiotropium (SPIRIVA HANDIHALER) 18 MCG inhalation capsule Place 1 capsule (18 mcg total) into inhaler and inhale daily. 08/28/16   Ashley Murrain, NP    Family History Family History  Problem Relation Age of Onset  . Heart disease Mother   . Hypertension Mother   . Hyperlipidemia Mother   . Arthritis Mother   . Asthma Mother   . Cancer Father   . Hypertension Father   . Hyperlipidemia Father   . Hypertension Sister   . Hyperlipidemia Sister   . Hypertension Brother   . Hyperlipidemia Brother     Social History Social History  Substance Use Topics  .  Smoking status: Current Every Day Smoker    Packs/day: 2.00    Years: 32.00    Types: Cigarettes  . Smokeless tobacco: Never Used  . Alcohol use No     Allergies   Chantix [varenicline]; Lyrica [pregabalin]; and Gabapentin   Review of Systems Review of Systems  Genitourinary:       No bowel or bladder incontinence  Musculoskeletal: Positive for arthralgias and back pain. Negative for neck pain.  Neurological: Negative for syncope, weakness and headaches.     Physical Exam Updated Vital Signs BP 138/83 (BP Location: Left Arm)   Pulse 87   Temp 97.7 F (36.5 C)   Resp 18   Ht 5\' 3"  (1.6 m)   Wt 103.9 kg (229 lb)   SpO2 97%   BMI 40.57 kg/m   Physical Exam  Constitutional: She appears well-developed and well-nourished. No distress.  HENT:  Head: Normocephalic and atraumatic.  Eyes: Conjunctivae are normal. Right eye exhibits no discharge. Left eye exhibits no discharge.  Neck: Normal range of motion. Neck supple. No JVD present. No tracheal deviation present.  Cardiovascular: Normal rate and intact distal pulses.   2+ DP/PT pulses bilaterally, no calf tenderness bilaterally  Pulmonary/Chest: Effort normal.  Abdominal: She exhibits no distension.  Musculoskeletal: She exhibits tenderness. She exhibits no edema or deformity.  Mild bilateral swelling to the anterior knee, mild ecchymosis anteriorly overlying the patellas.  Decreased range of motion with flexion secondary to pain.  No crepitus or deformity noted.  No varus or valgus deformity, negative anterior/posterior drawer test bilaterally.  Maximally tender to palpation overlying the lateral joint line in the bilateral knees.   No midline spine TTP, bilateral mild paraspinal muscle tenderness with no deformity, crepitus, or step-off noted.  Mild tenderness to palpation of the lateral hips.  Normal range of motion of the hips and the lumbar spine.  5/5 strength of BLE major muscle groups.  Neurological: She is alert. No  sensory deficit. She exhibits normal muscle tone.  Fluent speech, no facial droop, sensation intact to soft touch of bilateral lower extremities.  Antalgic gait, difficulty with Heel Walk and Toe Walk secondary to pain but she has good gastroc strength  Skin: Skin is warm and dry. No erythema.  Psychiatric: She has a normal mood and affect. Her behavior is normal.  Nursing note and vitals reviewed.    ED Treatments / Results  Labs (all labs ordered are listed, but only abnormal results are displayed) Labs Reviewed - No data to display  EKG  EKG Interpretation None       Radiology Dg Knee Complete 4 Views Left  Result Date: 05/19/2017 CLINICAL DATA:  Bilateral  anterior knee pain.  Recent fall. EXAM: LEFT KNEE - COMPLETE 4+ VIEW; RIGHT KNEE - COMPLETE 4+ VIEW COMPARISON:  None. FINDINGS: No evidence of fracture, dislocation, or joint effusion at either knee. No evidence of arthropathy or other focal bone abnormality. Soft tissues are unremarkable. IMPRESSION: Normal radiographs of both knees. Electronically Signed   By: Ulyses Jarred M.D.   On: 05/19/2017 22:08   Dg Knee Complete 4 Views Right  Result Date: 05/19/2017 CLINICAL DATA:  Bilateral anterior knee pain.  Recent fall. EXAM: LEFT KNEE - COMPLETE 4+ VIEW; RIGHT KNEE - COMPLETE 4+ VIEW COMPARISON:  None. FINDINGS: No evidence of fracture, dislocation, or joint effusion at either knee. No evidence of arthropathy or other focal bone abnormality. Soft tissues are unremarkable. IMPRESSION: Normal radiographs of both knees. Electronically Signed   By: Ulyses Jarred M.D.   On: 05/19/2017 22:08    Procedures Procedures (including critical care time)  Medications Ordered in ED Medications  naproxen (NAPROSYN) tablet 500 mg (not administered)     Initial Impression / Assessment and Plan / ED Course  I have reviewed the triage vital signs and the nursing notes.  Pertinent labs & imaging results that were available during my care  of the patient were reviewed by me and considered in my medical decision making (see chart for details).     Patient with bilateral knee pain secondary to fallyesterday.  Afebrile, vital signs are stable.  No focal neurological deficits on examination.  Radiographs of the bilateral knees show no acute abnormality.  She has mild paralumbar muscle pain and mild lateral hip pain on palpation, however she is able to range the hip joint without difficulty.  She is able to ambulate and bear weight without difficulty, although it is painful.  She declines lumbar spine or hip x-rays at this time and I think this is reasonable.  I have a low suspicion of fracture or dislocation or acute bony spine injury.  No red flag signs concerning for cauda equina or spinal cord compression.  RICE therapy indicated and discussed, will discharge patient with knee sleeves.  She will be given Naprosyn for pain.  Recommend follow-up with primary care physician or orthopedist for reevaluation if symptoms persist.  Discussed indications for return to the ED. Pt verbalized understanding of and agreement with plan and is safe for discharge home at this time.   Final Clinical Impressions(s) / ED Diagnoses   Final diagnoses:  Acute pain of both knees  Fall, initial encounter  Acute bilateral low back pain without sciatica    New Prescriptions New Prescriptions   NAPROXEN (NAPROSYN) 500 MG TABLET    Take 1 tablet (500 mg total) by mouth 2 (two) times daily with a meal.     Renita Papa, PA-C 05/19/17 2347    Charlesetta Shanks, MD 05/25/17 1655

## 2017-05-19 NOTE — ED Triage Notes (Signed)
Reports fall at work yesterday, states tripped over a large hose at work. Landed on her knees. Ambulatory, but at a slow pace. Some swelling, bruising. Reports pain shoots up to L hip.

## 2017-09-15 ENCOUNTER — Emergency Department (HOSPITAL_COMMUNITY): Payer: BLUE CROSS/BLUE SHIELD

## 2017-09-15 ENCOUNTER — Emergency Department (HOSPITAL_COMMUNITY)
Admission: EM | Admit: 2017-09-15 | Discharge: 2017-09-15 | Disposition: A | Discharge status: Home / Self Care | Payer: BLUE CROSS/BLUE SHIELD | Attending: Emergency Medicine | Admitting: Emergency Medicine

## 2017-09-15 ENCOUNTER — Other Ambulatory Visit: Payer: Self-pay

## 2017-09-15 ENCOUNTER — Encounter (HOSPITAL_COMMUNITY): Payer: Self-pay | Admitting: *Deleted

## 2017-09-15 DIAGNOSIS — Z79899 Other long term (current) drug therapy: Secondary | ICD-10-CM | POA: Insufficient documentation

## 2017-09-15 DIAGNOSIS — F17210 Nicotine dependence, cigarettes, uncomplicated: Secondary | ICD-10-CM | POA: Diagnosis not present

## 2017-09-15 DIAGNOSIS — R0602 Shortness of breath: Secondary | ICD-10-CM | POA: Diagnosis present

## 2017-09-15 DIAGNOSIS — J441 Chronic obstructive pulmonary disease with (acute) exacerbation: Secondary | ICD-10-CM | POA: Diagnosis not present

## 2017-09-15 LAB — CBC WITH DIFFERENTIAL/PLATELET
BASOS PCT: 0 %
Basophils Absolute: 0 10*3/uL (ref 0.0–0.1)
EOS ABS: 0.2 10*3/uL (ref 0.0–0.7)
EOS PCT: 2 %
HEMATOCRIT: 41.6 % (ref 36.0–46.0)
Hemoglobin: 13.4 g/dL (ref 12.0–15.0)
Lymphocytes Relative: 27 %
Lymphs Abs: 2.8 10*3/uL (ref 0.7–4.0)
MCH: 30.8 pg (ref 26.0–34.0)
MCHC: 32.2 g/dL (ref 30.0–36.0)
MCV: 95.6 fL (ref 78.0–100.0)
MONO ABS: 0.7 10*3/uL (ref 0.1–1.0)
MONOS PCT: 7 %
Neutro Abs: 6.7 10*3/uL (ref 1.7–7.7)
Neutrophils Relative %: 64 %
Platelets: 214 10*3/uL (ref 150–400)
RBC: 4.35 MIL/uL (ref 3.87–5.11)
RDW: 14.5 % (ref 11.5–15.5)
WBC: 10.3 10*3/uL (ref 4.0–10.5)

## 2017-09-15 MED ORDER — ALBUTEROL SULFATE (2.5 MG/3ML) 0.083% IN NEBU: 2.5000 mg | INHALATION_SOLUTION | Freq: Four times a day (QID) | RESPIRATORY_TRACT | 1 refills | Status: AC | PRN

## 2017-09-15 MED ORDER — IPRATROPIUM-ALBUTEROL 0.5-2.5 (3) MG/3ML IN SOLN
3.0000 mL | Freq: Once | RESPIRATORY_TRACT | Status: AC
  Administered 2017-09-15: 3 mL via RESPIRATORY_TRACT
  Filled 2017-09-15: qty 3

## 2017-09-15 MED ORDER — TIOTROPIUM BROMIDE MONOHYDRATE 18 MCG IN CAPS: 18.0000 ug | ORAL_CAPSULE | Freq: Every day | RESPIRATORY_TRACT | 12 refills | Status: AC

## 2017-09-15 MED ORDER — ALBUTEROL SULFATE (2.5 MG/3ML) 0.083% IN NEBU
2.5000 mg | INHALATION_SOLUTION | Freq: Once | RESPIRATORY_TRACT | Status: AC
  Administered 2017-09-15: 2.5 mg via RESPIRATORY_TRACT
  Filled 2017-09-15: qty 3

## 2017-09-15 NOTE — ED Provider Notes (Signed)
[facility] EMERGENCY DEPARTMENT Provider Note   CSN: [id number] Arrival date & time: 09/15/17  0623     History   Chief Complaint Chief Complaint  Patient presents with  . Shortness of Breath    HPI Crystal Fuentes is a 47 y.o. female.  HPI   47 year old female with a significant past medical history COPD presents today with complaints of shortness of breath.  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  She notes worsening wheeze, worsening shortness of breath.  She notes nonproductive cough.  Patient reports that she is not on oxygen at home.  She denies any recent illnesses including fever.  She notes using Spiriva, Ventolin and nebulized albuterol at home but has run out of both the Spiriva and albuterol fluid.  Patient notes she has a follow-up appointment with her pulmonologist on March 5 at [facility].  Patient reports she continues to smoke but does not believe that that is contributing to her COPD.  Patient notes she is attempted to quit smoking in the past for 2 weeks but had a stressful event that caused her to return to smoking.   Past Medical History:  Diagnosis Date  . Arthritis   . Bipolar disorder ([place])   . Carpal tunnel syndrome   . COPD (chronic obstructive pulmonary disease) ([place])   . Heart murmur    "I was told I might have one"  . Migraine    Hx:  . Plantar fasciitis   . Pneumonia   . Trigger finger     Patient Active Problem List   Diagnosis Date Noted  . Tobacco abuse 08/08/2014  . Atypical chest pain 08/08/2014  . COPD exacerbation ([place]) 08/07/2014  . Acute respiratory failure with hypoxia ([place]) 08/07/2014  .   01/23/2013    Past Surgical History:  Procedure Laterality Date  . ADENOIDECTOMY  1970's  . CARPAL TUNNEL RELEASE Right 03/2014  . CESAREAN SECTION  1991  . CHOLECYSTECTOMY N/A 02/01/2013   Procedure: LAPAROSCOPIC CHOLECYSTECTOMY WITH  INTRAOPERATIVE CHOLANGIOGRAM;  Surgeon: Imogene Burn. Georgette Dover, MD;  Location: [facility];  Service: General;  Laterality: N/A;  . COLONOSCOPY  [id number]  . DIAGNOSTIC LAPAROSCOPY    . LAPAROSCOPIC LYSIS OF ADHESIONS N/A 02/01/2013   Procedure: LAPAROSCOPIC LYSIS OF ADHESIONS;  Surgeon: Imogene Burn. Georgette Dover, MD;  Location: [facility];  Service: General;  Laterality: N/A;  . TUBAL LIGATION  1991    OB History    No data available       Home Medications    Prior to Admission medications   Medication Sig Start Date End Date Taking? Authorizing Provider  albuterol (PROVENTIL) (2.5 MG/3ML) 0.083% nebulizer solution Take 3 mLs (2.5 mg total) by nebulization every 6 (six) hours as needed for wheezing or shortness of breath. 09/15/17   Herald Vallin, Dellis Filbert, PA-C  buPROPion (WELLBUTRIN SR) 150 MG 12 hr tablet Take 150 mg by mouth 2 (two) times daily.    [provider]  busPIRone (BUSPAR) 5 MG tablet Take 5 mg by mouth 2 (two) times daily.    [provider]  cyclobenzaprine (FLEXERIL) 10 MG tablet Take 1 tablet (10 mg total) by mouth 2 (two) times daily as needed for muscle spasms. 08/28/16   Ashley Murrain, NP  diclofenac (VOLTAREN) 50 MG EC tablet Take 1 tablet (50 mg total) by mouth 2 (two) times daily. 08/28/16   Ashley Murrain, NP  fexofenadine (ALLEGRA) 30 MG tablet Take 30 mg by mouth daily.    [provider]  naproxen (NAPROSYN) 500 MG tablet Take 1 tablet (500 mg total) by mouth 2 (two) times daily with a meal. 05/19/17   Fawze, Mina A, PA-C  omeprazole (PRILOSEC) 20 MG capsule Take 20 mg by mouth daily.    [provider]  tiotropium (SPIRIVA HANDIHALER) 18 MCG inhalation capsule Place 1 capsule (18 mcg total) into inhaler and inhale daily. 09/15/17   Okey Regal, PA-C    Family History Family History  Problem Relation Age of Onset  . Heart disease Mother   . Hypertension Mother   . Hyperlipidemia Mother   . Arthritis Mother   . Asthma Mother   . Cancer Father   .  Hypertension Father   . Hyperlipidemia Father   . Hypertension Sister   . Hyperlipidemia Sister   . Hypertension Brother   . Hyperlipidemia Brother     Social History Social History   Tobacco Use  . Smoking status: Current Every Day Smoker    Packs/day: 2.00    Years: 32.00    Pack years: 64.00    Types: Cigarettes  . Smokeless tobacco: Never Used  Substance Use Topics  . Alcohol use: No  . Drug use: No     Allergies   Chantix [varenicline]; Lyrica [pregabalin]; and Gabapentin   Review of Systems Review of Systems  All other systems reviewed and are negative.    Physical Exam Updated Vital Signs BP 126/70 (BP Location: Right Arm)   Pulse 80   Temp 98 F (36.7 C) (Oral)   Resp 20   Ht 5\' 3"  (1.6 m)   Wt 103 kg (227 lb)   SpO2 97%   BMI 40.21 kg/m   Physical Exam  Constitutional: She is oriented to person, place, and time. She appears well-developed and well-nourished.  HENT:  Head: Normocephalic and atraumatic.  Eyes: Conjunctivae are normal. Pupils are equal, round, and reactive to light. Right eye exhibits no discharge. Left eye exhibits no discharge. No scleral icterus.  Neck: Normal range of motion. No JVD present. No tracheal deviation present.  Cardiovascular: Normal rate, regular rhythm and normal heart sounds.  Pulmonary/Chest: Effort normal. No stridor. She has wheezes. She has no rales. She exhibits no tenderness.  Fine prolonged bilateral lower expiratory wheeze  Musculoskeletal:  Clubbing of fingers  Neurological: She is alert and oriented to person, place, and time. Coordination normal.  Psychiatric: She has a normal mood and affect. Her behavior is normal. Judgment and thought content normal.  Nursing note and vitals reviewed.    ED Treatments / Results  Labs (all labs ordered are listed, but only abnormal results are displayed) Labs Reviewed  CBC WITH DIFFERENTIAL/PLATELET    EKG  EKG Interpretation None       Radiology Dg  Chest 2 View  Result Date: 09/15/2017 CLINICAL DATA:  Cough for 3 months.  Shortness of breath. EXAM: CHEST  2 VIEW COMPARISON:  08/30/2015 FINDINGS: The heart size and mediastinal contours are within normal limits. Both lungs are clear. The visualized skeletal structures are unremarkable. IMPRESSION: No active cardiopulmonary disease. Electronically Signed   By: Kerby Moors M.D.   On: 09/15/2017 11:17    Procedures Procedures (including critical care time)  Medications Ordered in ED Medications  albuterol (PROVENTIL) (2.5 MG/3ML) 0.083% nebulizer solution 2.5 mg (2.5 mg Nebulization Given 09/15/17 1017)  ipratropium-albuterol (DUONEB) 0.5-2.5 (3) MG/3ML nebulizer solution 3 mL (3 mLs  Nebulization Given 09/15/17 1227)     Initial Impression / Assessment and Plan / ED Course  I have reviewed the triage vital signs and the nursing notes.  Pertinent labs & imaging results that were available during my care of the patient were reviewed by me and considered in my medical decision making (see chart for details).      Final Clinical Impressions(s) / ED Diagnoses   Final diagnoses:  COPD exacerbation ([place])    47 year old female presents today with worsening COPD.  Patient has very fine wheeze after receiving a breathing treatment, she had a second breathing treatment completely resolve her symptoms.  Patient's medications were refilled, she will follow-up closely with pulmonary specialist, she is given strict return precautions, she verbalized understanding and agreement to today's plan.  No significant laboratory abnormalities, chest x-ray with no acute findings.  ED Discharge Orders        Ordered    albuterol (PROVENTIL) (2.5 MG/3ML) 0.083% nebulizer solution  Every 6 hours PRN     09/15/17 1254    tiotropium (SPIRIVA HANDIHALER) 18 MCG inhalation capsule  Daily     09/15/17 1254       Okey Regal, PA-C 09/15/17 1313    Tanna Furry, MD 09/16/17 2222

## 2017-09-15 NOTE — ED Notes (Signed)
Declined W/C at D/C and was escorted to lobby by RN. 

## 2017-09-15 NOTE — Discharge Instructions (Signed)
Please read attached information. If you experience any new or worsening signs or symptoms please return to the emergency room for evaluation. Please follow-up with your primary care provider or specialist as discussed. Please use medication prescribed only as directed and discontinue taking if you have any concerning signs or symptoms.   °

## 2017-09-15 NOTE — ED Triage Notes (Signed)
States she has copd , states she is breathing fumes in at work and at the end of the day she can taste the fumes. States her breathing is getting. C/o chest pain when she breaths.

## 2017-09-19 ENCOUNTER — Other Ambulatory Visit: Payer: Self-pay

## 2017-09-19 ENCOUNTER — Emergency Department (HOSPITAL_COMMUNITY)
Admission: EM | Admit: 2017-09-19 | Discharge: 2017-09-19 | Disposition: A | Discharge status: Home / Self Care | Payer: BLUE CROSS/BLUE SHIELD | Attending: Emergency Medicine | Admitting: Emergency Medicine

## 2017-09-19 ENCOUNTER — Encounter (HOSPITAL_COMMUNITY): Payer: Self-pay

## 2017-09-19 DIAGNOSIS — R112 Nausea with vomiting, unspecified: Secondary | ICD-10-CM | POA: Diagnosis not present

## 2017-09-19 DIAGNOSIS — F17210 Nicotine dependence, cigarettes, uncomplicated: Secondary | ICD-10-CM | POA: Diagnosis not present

## 2017-09-19 DIAGNOSIS — J449 Chronic obstructive pulmonary disease, unspecified: Secondary | ICD-10-CM | POA: Insufficient documentation

## 2017-09-19 DIAGNOSIS — R0981 Nasal congestion: Secondary | ICD-10-CM | POA: Insufficient documentation

## 2017-09-19 DIAGNOSIS — R197 Diarrhea, unspecified: Secondary | ICD-10-CM | POA: Insufficient documentation

## 2017-09-19 DIAGNOSIS — Z79899 Other long term (current) drug therapy: Secondary | ICD-10-CM | POA: Insufficient documentation

## 2017-09-19 LAB — COMPREHENSIVE METABOLIC PANEL
ALBUMIN: 3.4 g/dL — AB (ref 3.5–5.0)
ALT: 15 U/L (ref 14–54)
AST: 12 U/L — AB (ref 15–41)
Alkaline Phosphatase: 73 U/L (ref 38–126)
Anion gap: 11 (ref 5–15)
BILIRUBIN TOTAL: 0.2 mg/dL — AB (ref 0.3–1.2)
BUN: 8 mg/dL (ref 6–20)
CHLORIDE: 99 mmol/L — AB (ref 101–111)
CO2: 28 mmol/L (ref 22–32)
CREATININE: 0.64 mg/dL (ref 0.44–1.00)
Calcium: 9.2 mg/dL (ref 8.9–10.3)
GFR calc Af Amer: >60 mL/min (ref >60)
GLUCOSE: 94 mg/dL (ref 65–99)
Potassium: 4.1 mmol/L (ref 3.5–5.1)
Sodium: 138 mmol/L (ref 135–145)
Total Protein: 7.4 g/dL (ref 6.5–8.1)

## 2017-09-19 LAB — URINALYSIS, ROUTINE W REFLEX MICROSCOPIC
BILIRUBIN URINE: NEGATIVE
GLUCOSE, UA: NEGATIVE mg/dL
HGB URINE DIPSTICK: NEGATIVE
KETONES UR: NEGATIVE mg/dL
LEUKOCYTES UA: NEGATIVE
Nitrite: NEGATIVE
PH: 7 (ref 5.0–8.0)
PROTEIN: NEGATIVE mg/dL
Specific Gravity, Urine: 1.008 (ref 1.005–1.030)

## 2017-09-19 LAB — CBC
HEMATOCRIT: 43.2 % (ref 36.0–46.0)
Hemoglobin: 14.6 g/dL (ref 12.0–15.0)
MCH: 31.7 pg (ref 26.0–34.0)
MCHC: 33.8 g/dL (ref 30.0–36.0)
MCV: 93.9 fL (ref 78.0–100.0)
PLATELETS: 212 10*3/uL (ref 150–400)
RBC: 4.6 MIL/uL (ref 3.87–5.11)
RDW: 14.3 % (ref 11.5–15.5)
WBC: 7.1 10*3/uL (ref 4.0–10.5)

## 2017-09-19 LAB — LIPASE, BLOOD: LIPASE: 31 U/L (ref 11–51)

## 2017-09-19 LAB — I-STAT BETA HCG BLOOD, ED (MC, WL, AP ONLY): I-stat hCG, quantitative: <5 m[IU]/mL (ref <5)

## 2017-09-19 MED ORDER — FLUTICASONE PROPIONATE 50 MCG/ACT NA SUSP: 2.0000 | Freq: Every day | NASAL | 0 refills | Status: AC

## 2017-09-19 MED ORDER — ONDANSETRON HCL 4 MG PO TABS: 4.0000 mg | ORAL_TABLET | Freq: Three times a day (TID) | ORAL | 0 refills | Status: AC | PRN

## 2017-09-19 NOTE — Discharge Instructions (Signed)
You were given a prescription for Zofran which you  can take every 8 hours for nausea and vomiting as needed.  I also gave you a perception for Flonase. You can use 2 sprays in each nostril once a day for your nasal congestion.  You should follow-up with your primary care doctor within 1 week for reevaluation to make sure that your symptoms are improving.    If you do not have a primary care provider, information for a healthcare clinic has been provided for you to make arrangements for follow up care. Please return to the ER sooner if you have any new or worsening symptoms, or if you have any of the following symptoms:  Abdominal pain that does not go away.  You have a fever.  You keep throwing up (vomiting).  The pain is felt only in portions of the abdomen. Pain in the right side could possibly be appendicitis. In an adult, pain in the left lower portion of the abdomen could be colitis or diverticulitis.  You pass bloody or black tarry stools.  There is bright red blood in the stool.  The constipation stays for more than 4 days.  There is belly (abdominal) or rectal pain.  You do not seem to be getting better.  You have any questions or concerns.

## 2017-09-19 NOTE — ED Notes (Signed)
Pt ambulated to restroom located beside room, tolerated well. 

## 2017-09-19 NOTE — ED Triage Notes (Signed)
Pt reports n/v and diarrhea since Wednesday with generalized abdominal pain that began on thursday

## 2017-09-19 NOTE — ED Provider Notes (Signed)
[facility] EMERGENCY DEPARTMENT Provider Note   CSN: [id number] Arrival date & time: 09/19/17  1052     History   Chief Complaint Chief Complaint  Patient presents with  . Emesis    HPI Crystal Fuentes is a 47 y.o. female.  HPI   47 y/o female presenting to the ED today c/o nausiea, vomiting, and diarrhea that began 4 days ago. States that vomiting has resolved and she has not had any episodes since last night.  States she was to tolerate a cup of coffee and water bottle this morning.  She reports she had one episode of diarrhea earlier this morning. States she started to have abd pain after the episodes of vomiting several days ago. States pain is constant and rates it 7/10. States it is a cramping feeling to the bilat lower abdomen. No abd distension.  Denies any blood in vomit or stool. No melena. Denies any urinary or vaginal sxs. No flank pain or fevers.   Pt also c/o sinus congestion/sinus pressure for several days. C/o bilat ear fullness. Not taking medications for this.  No fevers.   Past Medical History:  Diagnosis Date  . Arthritis   . Bipolar disorder ([place])   . Carpal tunnel syndrome   . COPD (chronic obstructive pulmonary disease) ([place])   . Heart murmur    "I was told I might have one"  . Migraine    Hx:  . Plantar fasciitis   . Pneumonia   . Trigger finger     Patient Active Problem List   Diagnosis Date Noted  . Tobacco abuse 08/08/2014  . Atypical chest pain 08/08/2014  . COPD exacerbation ([place]) 08/07/2014  . Acute respiratory failure with hypoxia ([place]) 08/07/2014  .   01/23/2013    Past Surgical History:  Procedure Laterality Date  . ADENOIDECTOMY  1970's  . CARPAL TUNNEL RELEASE Right 03/2014  . CESAREAN SECTION  1991  . CHOLECYSTECTOMY N/A 02/01/2013   Procedure: LAPAROSCOPIC CHOLECYSTECTOMY WITH INTRAOPERATIVE CHOLANGIOGRAM;  Surgeon: Imogene Burn. Georgette Dover, MD;  Location: [facility];  Service: General;  Laterality: N/A;  . COLONOSCOPY   [id number]  . DIAGNOSTIC LAPAROSCOPY    . LAPAROSCOPIC LYSIS OF ADHESIONS N/A 02/01/2013   Procedure: LAPAROSCOPIC LYSIS OF ADHESIONS;  Surgeon: Imogene Burn. Georgette Dover, MD;  Location: [facility];  Service: General;  Laterality: N/A;  . TUBAL LIGATION  1991    OB History    No data available       Home Medications    Prior to Admission medications   Medication Sig Start Date End Date Taking? Authorizing Provider  albuterol (PROVENTIL) (2.5 MG/3ML) 0.083% nebulizer solution Take 3 mLs (2.5 mg total) by nebulization every 6 (six) hours as needed for wheezing or shortness of breath. 09/15/17   Hedges, Dellis Filbert, PA-C  buPROPion (WELLBUTRIN SR) 150 MG 12 hr tablet Take 150 mg by mouth 2 (two) times daily.    [provider]  busPIRone (BUSPAR) 5 MG tablet Take 5 mg by mouth 2 (two) times daily.    [provider]  cyclobenzaprine (FLEXERIL) 10 MG tablet Take 1 tablet (10 mg total) by mouth 2 (two) times daily as needed for muscle spasms. 08/28/16   Ashley Murrain, NP  diclofenac (VOLTAREN) 50 MG EC tablet Take 1 tablet (50 mg total) by mouth 2 (two) times daily. 08/28/16   Ashley Murrain, NP  fexofenadine (ALLEGRA) 30 MG tablet Take 30 mg by mouth daily.    [provider]  fluticasone (FLONASE) 50 MCG/ACT nasal spray Place 2 sprays into both nostrils daily. 09/19/17   Cassiel Fernandez S, PA-C  naproxen (NAPROSYN) 500 MG tablet Take 1 tablet (500 mg total) by mouth 2 (two) times daily with a meal. 05/19/17   Fawze, Mina A, PA-C  omeprazole (PRILOSEC) 20 MG capsule Take 20 mg by mouth daily.    [provider]  ondansetron (ZOFRAN) 4 MG tablet Take 1 tablet (4 mg total) by mouth every 8 (eight) hours as needed for nausea or vomiting. 09/19/17   Lu Paradise S, PA-C  tiotropium (SPIRIVA HANDIHALER) 18 MCG inhalation capsule Place 1 capsule (18 mcg total) into inhaler and inhale daily. 09/15/17   Okey Regal, PA-C    Family History Family History  Problem Relation Age of Onset    . Heart disease Mother   . Hypertension Mother   . Hyperlipidemia Mother   . Arthritis Mother   . Asthma Mother   . Cancer Father   . Hypertension Father   . Hyperlipidemia Father   . Hypertension Sister   . Hyperlipidemia Sister   . Hypertension Brother   . Hyperlipidemia Brother     Social History Social History   Tobacco Use  . Smoking status: Current Every Day Smoker    Packs/day: 2.00    Years: 32.00    Pack years: 64.00    Types: Cigarettes  . Smokeless tobacco: Never Used  Substance Use Topics  . Alcohol use: No  . Drug use: No     Allergies   Chantix [varenicline]; Lyrica [pregabalin]; and Gabapentin   Review of Systems Review of Systems  Constitutional: Negative for chills and fever.  HENT: Positive for congestion and sinus pressure.   Eyes: Negative for pain and visual disturbance.  Respiratory: Negative for cough (chronic unchanged) and shortness of breath ( chronic unchanged).   Cardiovascular: Negative for chest pain and leg swelling.  Gastrointestinal: Positive for abdominal pain, diarrhea, nausea and vomiting. Negative for blood in stool and constipation.  Genitourinary: Negative for dysuria, hematuria and urgency.  Musculoskeletal: Negative for back pain.  Skin: Negative for rash.  Neurological: Negative for dizziness, weakness, numbness and headaches.     Physical Exam Updated Vital Signs BP 138/86 (BP Location: Right Arm)   Pulse 83   Temp 98.6 F (37 C) (Oral)   Resp 18   SpO2 93%   Physical Exam  Constitutional: She is oriented to person, place, and time. She appears well-developed and well-nourished. No distress.  Nontoxic-appearing.  HENT:  Head: Normocephalic and atraumatic.  Right Ear: External ear normal.  Left Ear: External ear normal.  Bilateral TMs with fluid behind them.  No pharyngeal erythema or tonsillar exudates.  Eyes: Conjunctivae and EOM are normal. Pupils are equal, round, and reactive to light.  Neck: Normal  range of motion. Neck supple.  Cardiovascular: Normal rate, regular rhythm, normal heart sounds and intact distal pulses.  No murmur heard. Pulmonary/Chest: Effort normal and breath sounds normal.  Wheezing to the bilat upper lung fields (chronic per pt)  Abdominal: Soft. Bowel sounds are normal. She exhibits no distension and no mass. There is no tenderness. There is no rebound and no guarding.  Musculoskeletal: She exhibits no edema.  Neurological: She is alert and oriented to person, place, and time. No cranial nerve deficit.  Skin: Skin is warm and dry. Capillary refill takes less than 2 seconds.  Psychiatric: She has a normal mood and affect.  Nursing note and vitals reviewed.  ED Treatments / Results  Labs (all labs ordered are listed, but only abnormal results are displayed) Labs Reviewed  COMPREHENSIVE METABOLIC PANEL - Abnormal; Notable for the following components:      Result Value   Chloride 99 (*)    Albumin 3.4 (*)    AST 12 (*)    Total Bilirubin 0.2 (*)    All other components within normal limits  LIPASE, BLOOD  CBC  URINALYSIS, ROUTINE W REFLEX MICROSCOPIC  I-STAT BETA HCG BLOOD, ED ([facility], [facility], [facility] ONLY)    EKG  EKG Interpretation None       Radiology No results found.  Procedures Procedures (including critical care time)  Medications Ordered in ED Medications - No data to display   Initial Impression / Assessment and Plan / ED Course  I have reviewed the triage vital signs and the nursing notes.  Pertinent labs & imaging results that were available during my care of the patient were reviewed by me and considered in my medical decision making (see chart for details).     Final Clinical Impressions(s) / ED Diagnoses   Final diagnoses:  Nausea vomiting and diarrhea  Nasal congestion    Patient with symptoms consistent with viral gastroenteritis.  Vitals are stable, no fever.  No signs of dehydration, tolerating PO fluids > 6 oz.  Lungs are  clear.  No focal abdominal pain, no concern for appendicitis, cholecystitis, pancreatitis, ruptured viscus, UTI, kidney stone, or any other abdominal etiology. Labs reviewed and are benign. UA negative for UTI. Pregnancy test negative. gave rx for zofran and advised patient to push fluids at home.  Also gave Flonase for nasal congestion, symptoms likely due to allergies as patient has had no associated fevers or other URI symptoms.  Patient counseled.   Patient discharged home with symptomatic treatment and given strict instructions for follow-up with their primary care physician.  I have also discussed reasons to return immediately to the ER.  Patient expresses understanding and agrees with plan.  ED Discharge Orders        Ordered    ondansetron (ZOFRAN) 4 MG tablet  Every 8 hours PRN     09/19/17 1515    fluticasone (FLONASE) 50 MCG/ACT nasal spray  Daily     09/19/17 1515       Ethelyne Erich S, PA-C 09/19/17 2345    Tanna Furry, MD 09/20/17 1144

## 2018-09-05 ENCOUNTER — Encounter (HOSPITAL_COMMUNITY): Payer: Self-pay

## 2018-09-05 ENCOUNTER — Other Ambulatory Visit: Payer: Self-pay

## 2018-09-05 ENCOUNTER — Emergency Department (HOSPITAL_COMMUNITY): Payer: BLUE CROSS/BLUE SHIELD

## 2018-09-05 ENCOUNTER — Emergency Department (HOSPITAL_COMMUNITY)
Admission: EM | Admit: 2018-09-05 | Discharge: 2018-09-05 | Disposition: A | Discharge status: Home / Self Care | Payer: BLUE CROSS/BLUE SHIELD | Attending: Emergency Medicine | Admitting: Emergency Medicine

## 2018-09-05 DIAGNOSIS — J449 Chronic obstructive pulmonary disease, unspecified: Secondary | ICD-10-CM | POA: Diagnosis not present

## 2018-09-05 DIAGNOSIS — F319 Bipolar disorder, unspecified: Secondary | ICD-10-CM | POA: Insufficient documentation

## 2018-09-05 DIAGNOSIS — R1032 Left lower quadrant pain: Secondary | ICD-10-CM | POA: Diagnosis present

## 2018-09-05 DIAGNOSIS — K625 Hemorrhage of anus and rectum: Secondary | ICD-10-CM

## 2018-09-05 DIAGNOSIS — F17210 Nicotine dependence, cigarettes, uncomplicated: Secondary | ICD-10-CM | POA: Diagnosis not present

## 2018-09-05 DIAGNOSIS — K644 Residual hemorrhoidal skin tags: Secondary | ICD-10-CM | POA: Insufficient documentation

## 2018-09-05 DIAGNOSIS — R110 Nausea: Secondary | ICD-10-CM | POA: Insufficient documentation

## 2018-09-05 DIAGNOSIS — Z79899 Other long term (current) drug therapy: Secondary | ICD-10-CM | POA: Insufficient documentation

## 2018-09-05 DIAGNOSIS — K5900 Constipation, unspecified: Secondary | ICD-10-CM | POA: Diagnosis not present

## 2018-09-05 LAB — COMPREHENSIVE METABOLIC PANEL
ALK PHOS: 73 U/L (ref 38–126)
ALT: 12 U/L (ref 0–44)
AST: 13 U/L — ABNORMAL LOW (ref 15–41)
Albumin: 3.4 g/dL — ABNORMAL LOW (ref 3.5–5.0)
Anion gap: 9 (ref 5–15)
BUN: 10 mg/dL (ref 6–20)
CALCIUM: 8.9 mg/dL (ref 8.9–10.3)
CHLORIDE: 101 mmol/L (ref 98–111)
CO2: 27 mmol/L (ref 22–32)
CREATININE: 0.8 mg/dL (ref 0.44–1.00)
Glucose, Bld: 121 mg/dL — ABNORMAL HIGH (ref 70–99)
Potassium: 4 mmol/L (ref 3.5–5.1)
Sodium: 137 mmol/L (ref 135–145)
Total Bilirubin: 0.4 mg/dL (ref 0.3–1.2)
Total Protein: 7.2 g/dL (ref 6.5–8.1)

## 2018-09-05 LAB — CBC
HCT: 41.1 % (ref 36.0–46.0)
Hemoglobin: 12.8 g/dL (ref 12.0–15.0)
MCH: 29.4 pg (ref 26.0–34.0)
MCHC: 31.1 g/dL (ref 30.0–36.0)
MCV: 94.3 fL (ref 80.0–100.0)
NRBC: 0 % (ref 0.0–0.2)
PLATELETS: 194 10*3/uL (ref 150–400)
RBC: 4.36 MIL/uL (ref 3.87–5.11)
RDW: 12.4 % (ref 11.5–15.5)
WBC: 4.2 10*3/uL (ref 4.0–10.5)

## 2018-09-05 LAB — URINALYSIS, ROUTINE W REFLEX MICROSCOPIC
Bilirubin Urine: NEGATIVE
Glucose, UA: NEGATIVE mg/dL
Hgb urine dipstick: NEGATIVE
KETONES UR: NEGATIVE mg/dL
LEUKOCYTE UA: NEGATIVE
NITRITE: NEGATIVE
PROTEIN: NEGATIVE mg/dL
Specific Gravity, Urine: 1.01 (ref 1.005–1.030)
pH: 5 (ref 5.0–8.0)

## 2018-09-05 LAB — POC OCCULT BLOOD, ED: Fecal Occult Bld: POSITIVE — AB

## 2018-09-05 LAB — LIPASE, BLOOD: LIPASE: 35 U/L (ref 11–51)

## 2018-09-05 MED ORDER — POLYETHYLENE GLYCOL 3350 17 G PO PACK: 17.0000 g | PACK | Freq: Every day | ORAL | 0 refills | Status: AC

## 2018-09-05 MED ORDER — SODIUM CHLORIDE 0.9% FLUSH: 3.0000 mL | Freq: Once | INTRAVENOUS | Status: DC

## 2018-09-05 MED ORDER — IOHEXOL 300 MG/ML  SOLN
100.0000 mL | Freq: Once | INTRAMUSCULAR | Status: AC | PRN
  Administered 2018-09-05: 100 mL via INTRAVENOUS

## 2018-09-05 NOTE — ED Provider Notes (Signed)
[facility] EMERGENCY DEPARTMENT Provider Note   CSN: [id number] Arrival date & time: 09/05/18  1001     History   Chief Complaint Chief Complaint  Patient presents with  . Abdominal Pain    HPI Crystal Fuentes is a 48 y.o. female past medical history of arthritis, bipolar, COPD who presents for evaluation of constipation, left lower quadrant abdominal pain, hematochezia.  She states that she has had these issues for many years and states they began when she was approximately 48 years old.  Had her first colonoscopy at age 56 and states that they found 7 polyps.  She reports that her last colonoscopy was about 4 years ago and states that that time, there was 11 polyps noted.  Patient denies any family history of colon cancer.  Patient states that the symptoms have been intermittent over the last several years but she feels like it is worsened over last 4 weeks.  She states that she has maybe 1 bowel movement a week and states that when she does, she has to strain and push very hard.  She states that she has noticed some bright red blood on stools and on the toilet paper when she wipes.  She denies any melena.  Additionally, patient states she has some intermittent left lower quadrant abdominal pain which she describes as a "stabbing" type pain.  Patient reports that the pain is worse whenever she has to have a bowel movement.  She reports some nausea but denies any vomiting.  She has been able to eat and drink without any difficulty.  She states she measured a fever 100.3 last night.  She went to urgent care yesterday for symptoms but she states nothing was done, prompting ED visit today.  She reports she has not seen a GI doctor in the last 4 years.  She states that she saw several doctors when constipation and blood in stool initially started in her 108s but they were not able to come to the reason as to why this was occurring.  She states she has tried apple juice, stool softeners  with no improvement.  Patient denies any chest pain, difficulty breathing, dysuria, hematuria, vomiting.  Patient states she is not on any blood thinners.  The history is provided by the patient.    Past Medical History:  Diagnosis Date  . Arthritis   . Bipolar disorder ([place])   . Carpal tunnel syndrome   . COPD (chronic obstructive pulmonary disease) ([place])   . Heart murmur    "I was told I might have one"  . Migraine    Hx:  . Plantar fasciitis   . Pneumonia   . Trigger finger     Patient Active Problem List   Diagnosis Date Noted  . Tobacco abuse 08/08/2014  . Atypical chest pain 08/08/2014  . COPD exacerbation ([place]) 08/07/2014  . Acute respiratory failure with hypoxia ([place]) 08/07/2014  .   01/23/2013    Past Surgical History:  Procedure Laterality Date  . ADENOIDECTOMY  1970's  . CARPAL TUNNEL RELEASE Right 03/2014  . CESAREAN SECTION  1991  . CHOLECYSTECTOMY N/A 02/01/2013   Procedure: LAPAROSCOPIC CHOLECYSTECTOMY WITH INTRAOPERATIVE CHOLANGIOGRAM;  Surgeon: Imogene Burn. Georgette Dover, MD;  Location: [facility];  Service: General;  Laterality: N/A;  . COLONOSCOPY  [id number]  . DIAGNOSTIC LAPAROSCOPY    . LAPAROSCOPIC LYSIS OF ADHESIONS N/A 02/01/2013   Procedure: LAPAROSCOPIC LYSIS OF ADHESIONS;  Surgeon: Imogene Burn. Georgette Dover, MD;  Location: [facility];  Service: General;  Laterality: N/A;  . TUBAL LIGATION  1991     OB History   No obstetric history on file.      Home Medications    Prior to Admission medications   Medication Sig Start Date End Date Taking? Authorizing Provider  albuterol (PROVENTIL) (2.5 MG/3ML) 0.083% nebulizer solution Take 3 mLs (2.5 mg total) by nebulization every 6 (six) hours as needed for wheezing or shortness of breath. 09/15/17   Hedges, Dellis Filbert, PA-C  buPROPion (WELLBUTRIN SR) 150 MG 12 hr tablet Take 150 mg by mouth 2 (two) times daily.    [provider]  busPIRone (BUSPAR) 5 MG tablet Take 5 mg by mouth 2 (two) times daily.    [provider]  cyclobenzaprine (FLEXERIL) 10 MG tablet Take 1 tablet (10 mg total) by mouth 2 (two) times daily as needed for muscle spasms. 08/28/16   Ashley Murrain, NP  diclofenac (VOLTAREN) 50 MG EC tablet Take 1 tablet (50 mg total) by mouth 2 (two) times daily. 08/28/16   Ashley Murrain, NP  fexofenadine (ALLEGRA) 30 MG tablet Take 30 mg by mouth daily.    [provider]  fluticasone (FLONASE) 50 MCG/ACT nasal spray Place 2 sprays into both nostrils daily. 09/19/17   Couture, Cortni S, PA-C  naproxen (NAPROSYN) 500 MG tablet Take 1 tablet (500 mg total) by mouth 2 (two) times daily with a meal. 05/19/17   Fawze, Mina A, PA-C  omeprazole (PRILOSEC) 20 MG capsule Take 20 mg by mouth daily.    [provider]  ondansetron (ZOFRAN) 4 MG tablet Take 1 tablet (4 mg total) by mouth every 8 (eight) hours as needed for nausea or vomiting. 09/19/17   Couture, Cortni S, PA-C  polyethylene glycol (MIRALAX) packet Take 17 g by mouth daily. 09/05/18   Volanda Napoleon, PA-C  tiotropium (SPIRIVA HANDIHALER) 18 MCG inhalation capsule Place 1 capsule (18 mcg total) into inhaler and inhale daily. 09/15/17   Okey Regal, PA-C    Family History Family History  Problem Relation Age of Onset  . Heart disease Mother   . Hypertension Mother   . Hyperlipidemia Mother   . Arthritis Mother   . Asthma Mother   . Cancer Father   . Hypertension Father   . Hyperlipidemia Father   . Hypertension Sister   . Hyperlipidemia Sister   . Hypertension Brother   . Hyperlipidemia Brother     Social History Social History   Tobacco Use  . Smoking status: Current Every Day Smoker    Packs/day: 2.00    Years: 32.00    Pack years: 64.00    Types: Cigarettes  . Smokeless tobacco: Never Used  Substance Use Topics  . Alcohol use: No  . Drug use: No     Allergies   Chantix [varenicline]; Lyrica [pregabalin]; and Gabapentin   Review of Systems Review of Systems  Constitutional: Positive for  fever.  Respiratory: Negative for cough and shortness of breath.   Cardiovascular: Negative for chest pain.  Gastrointestinal: Positive for abdominal pain, blood in stool, constipation and nausea. Negative for vomiting.  Genitourinary: Negative for dysuria and hematuria.  All other systems reviewed and are negative.    Physical Exam Updated Vital Signs BP 136/73 (BP Location: Left Arm)   Pulse 82   Temp 98.5 F (36.9 C) (Oral)   Resp 16   Ht 5\' 3"  (1.6 m)   Wt 112 kg   SpO2 96%   BMI  43.75 kg/m   Physical Exam Vitals signs and nursing note reviewed. Exam conducted with a chaperone present.  Constitutional:      Appearance: Normal appearance. She is well-developed.     Comments: Sitting comfortably on examination table  HENT:     Head: Normocephalic and atraumatic.  Eyes:     General: Lids are normal.     Conjunctiva/sclera: Conjunctivae normal.     Pupils: Pupils are equal, round, and reactive to light.  Neck:     Musculoskeletal: Full passive range of motion without pain.  Cardiovascular:     Rate and Rhythm: Normal rate and regular rhythm.     Pulses: Normal pulses.     Heart sounds: Normal heart sounds. No murmur. No friction rub. No gallop.   Pulmonary:     Effort: Pulmonary effort is normal.     Breath sounds: Normal breath sounds.     Comments: Lungs clear to auscultation bilaterally.  Symmetric chest rise.  No wheezing, rales, rhonchi. Abdominal:     Palpations: Abdomen is soft. Abdomen is not rigid.     Tenderness: There is abdominal tenderness in the left lower quadrant. There is no right CVA tenderness, left CVA tenderness or guarding. Negative signs include McBurney's sign.     Comments: Abdomen is soft, nondistended.  Tenderness noted to left lower quadrant.  No rigidity, guarding.  No tenderness noted McBurney's point.  Well-healed surgical incision scar noted to midline abdomen.  No CVA tenderness bilaterally.  Genitourinary:    Rectum: Guaiac result  negative. External hemorrhoid present.     Comments: The exam was performed with a chaperone present.  Small nonthrombosed external hemorrhoid noted at approximately 10:00 region.  No melena noted on DRE. Musculoskeletal: Normal range of motion.  Skin:    General: Skin is warm and dry.     Capillary Refill: Capillary refill takes less than 2 seconds.  Neurological:     Mental Status: She is alert and oriented to person, place, and time.  Psychiatric:        Speech: Speech normal.      ED Treatments / Results  Labs (all labs ordered are listed, but only abnormal results are displayed) Labs Reviewed  COMPREHENSIVE METABOLIC PANEL - Abnormal; Notable for the following components:      Result Value   Glucose, Bld 121 (*)    Albumin 3.4 (*)    AST 13 (*)    All other components within normal limits  URINALYSIS, ROUTINE W REFLEX MICROSCOPIC - Abnormal; Notable for the following components:   APPearance HAZY (*)    All other components within normal limits  POC OCCULT BLOOD, ED - Abnormal; Notable for the following components:   Fecal Occult Bld POSITIVE (*)    All other components within normal limits  LIPASE, BLOOD  CBC    EKG None  Radiology Ct Abdomen Pelvis W Contrast  Result Date: 09/05/2018 CLINICAL DATA:  Left lower quadrant abdominal pain. Constipation. Blood in stools. EXAM: CT ABDOMEN AND PELVIS WITH CONTRAST TECHNIQUE: Multidetector CT imaging of the abdomen and pelvis was performed using the standard protocol following bolus administration of intravenous contrast. CONTRAST:  150mL OMNIPAQUE IOHEXOL 300 MG/ML  SOLN COMPARISON:  None. FINDINGS: Lower chest: The lung bases are clear without focal nodule, mass, or airspace disease. Heart size is normal. No significant pleural or pericardial effusion is present. Hepatobiliary: No focal liver abnormality is seen. Status post cholecystectomy. No biliary dilatation. Pancreas: Unremarkable. No pancreatic ductal dilatation or  surrounding inflammatory changes. Spleen: Normal in size without focal abnormality. Adrenals/Urinary Tract: The adrenal glands are normal bilaterally. Kidneys and ureters are within normal limits. There is no stone or mass lesion. No obstruction is present. The urinary bladder is within normal limits. Stomach/Bowel: Stomach and duodenum are within normal limits. Small bowel is unremarkable. Terminal ileum is within normal limits. The appendix is visualized and normal. The ascending and transverse colon are normal. Descending colon is normal. Sigmoid colon is partially collapsed. No focal lesions are present. There is no obstruction. Vascular/Lymphatic: Scattered atherosclerotic calcifications are present in the distal aorta and proximal iliac vessels. There is no aneurysm. Mildly prominent lymph nodes are present at the inguinal regions bilaterally, right greater than left. These are ovoid and appears benign. No significant intra-abdominal or retroperitoneal adenopathy is present. Reproductive: Uterus and bilateral adnexa are unremarkable. Other: No abdominal wall hernia or abnormality. No abdominopelvic ascites. Musculoskeletal: Slight degenerative retrolisthesis present at L5-S1 with a vacuum disc. Mild endplate changes are present at L2-3. Vertebral body heights and alignment are otherwise normal. IMPRESSION: 1. Normal appearance of the small and large bowel without acute or focal lesion to explain left lower quadrant pain. Constipation, or blood in stool. 2. Prominent inguinal lymph nodes bilaterally, right greater than left. These are likely benign Electronically Signed   By: San Morelle M.D.   On: 09/05/2018 13:46    Procedures Procedures (including critical care time)  Medications Ordered in ED Medications  sodium chloride flush (NS) 0.9 % injection 3 mL (3 mLs Intravenous Not Given 09/05/18 1117)  iohexol (OMNIPAQUE) 300 MG/ML solution 100 mL (100 mLs Intravenous Contrast Given 09/05/18 1250)      Initial Impression / Assessment and Plan / ED Course  I have reviewed the triage vital signs and the nursing notes.  Pertinent labs & imaging results that were available during my care of the patient were reviewed by me and considered in my medical decision making (see chart for details).     48 year old female past most for bipolar, COPD presents for evaluation of constipation, abdominal pain, rectal bleeding.  This been ongoing issue for the last 30 years.  She states she has seen GI previously and her last colonoscopy was 4 years ago.  Feels like the pain in severity is worsened in the last month.  Reports low-grade fever of 100.3 last night.  No vomiting.  No chest pain, difficulty breathing. Patient is afebrile, non-toxic appearing, sitting comfortably on examination table. Vital signs reviewed and stable.  Consider diverticulitis versus internal hemorrhoids versus bleeding from constipation.  Low suspicion for small bowel obstruction.  History/physical exam not concerning for appendicitis.  Initial labs ordered at triage.  Given age, fever, location of pain, will plan for CT scan for evaluation of diverticulitis.  Lipase normal.  CBC shows no leukocytosis.  No evidence of anemia.  CMP shows albumin of 3.4.  LFTs within normal limits.  UA negative for any infectious etiology. Fecal occult is positive.   CT abdomen pelvis without any acute abnormality.  Normal appendix.  No evidence of diverticulitis.  There is mention of prominent inguinal lymph nodes with right greater than left.  I reviewed her previous CT scans and this was seen in 2015 as well.   At this time, patient with normal vitals.  Hemoglobin is stable.  She is not on any blood thinners.  Additionally, she has had heme positive stools that have been documented over last several years.  In 2013, she had similar  work-up.  I suspect that she may need a repeat colonoscopy for evaluation as well as GI referral for continued  constipation.   Discussed results with patient.  Repeat abdominal exam benign.  Vitals are stable.  Patient states she feels comfortable with plan and will follow up with GI on an outpatient basis. At this time, patient exhibits no emergent life-threatening condition that require further evaluation in ED or admission. Patient had ample opportunity for questions and discussion. All patient's questions were answered with full understanding. Strict return precautions discussed. Patient expresses understanding and agreement to plan.    Portions of this note were generated with [employer]. Dictation errors may occur despite best attempts at proofreading.   Final Clinical Impressions(s) / ED Diagnoses   Final diagnoses:  Left lower quadrant abdominal pain  Rectal bleeding  Constipation, unspecified constipation type    ED Discharge Orders         Ordered    polyethylene glycol [facility]) packet  Daily     09/05/18 1407           Volanda Napoleon, PA-C 09/05/18 1541    Sherwood Gambler, MD 09/09/18 1529

## 2018-09-05 NOTE — Discharge Instructions (Addendum)
As we discussed, on your exam today, he did have a small hemorrhoid.  He may have some internal hemorrhoids that are contributing to your symptoms.  Your blood levels look stable.  Your CT of your abdomen showed no reason for bleeding, abdominal pain.  Take MiraLAX, 1 packet/day, until you are having regular bowel movements.  Additionally, try to increase your fiber to help with constipation.  As we discussed, you will need follow-up with the GI doctor.  I provided outpatient referral for GI doctor.  Follow-up with [facility] to establish a primary care doctor if you do not have one.   Return the emergency department for any worsening abdominal pain, vomiting, worsening rectal bleeding, fevers or any other worsening or concerning symptoms.

## 2018-09-05 NOTE — ED Notes (Signed)
Pt discharged with no s/sx acute distress. Pt ambulatory

## 2018-09-05 NOTE — ED Triage Notes (Signed)
Pt reports LLQ abdominal pain with constipation and blood in stool for months. Pt ambulatory. VSS

## 2019-05-29 IMAGING — DX DG CHEST 2V
2 series · 2 of 2 positions shown · non-contrast
Comparison: 08/30/2015

CLINICAL DATA: Cough for 3 months.  Shortness of breath.

EXAM:
CHEST  2 VIEW

[w chest pa]
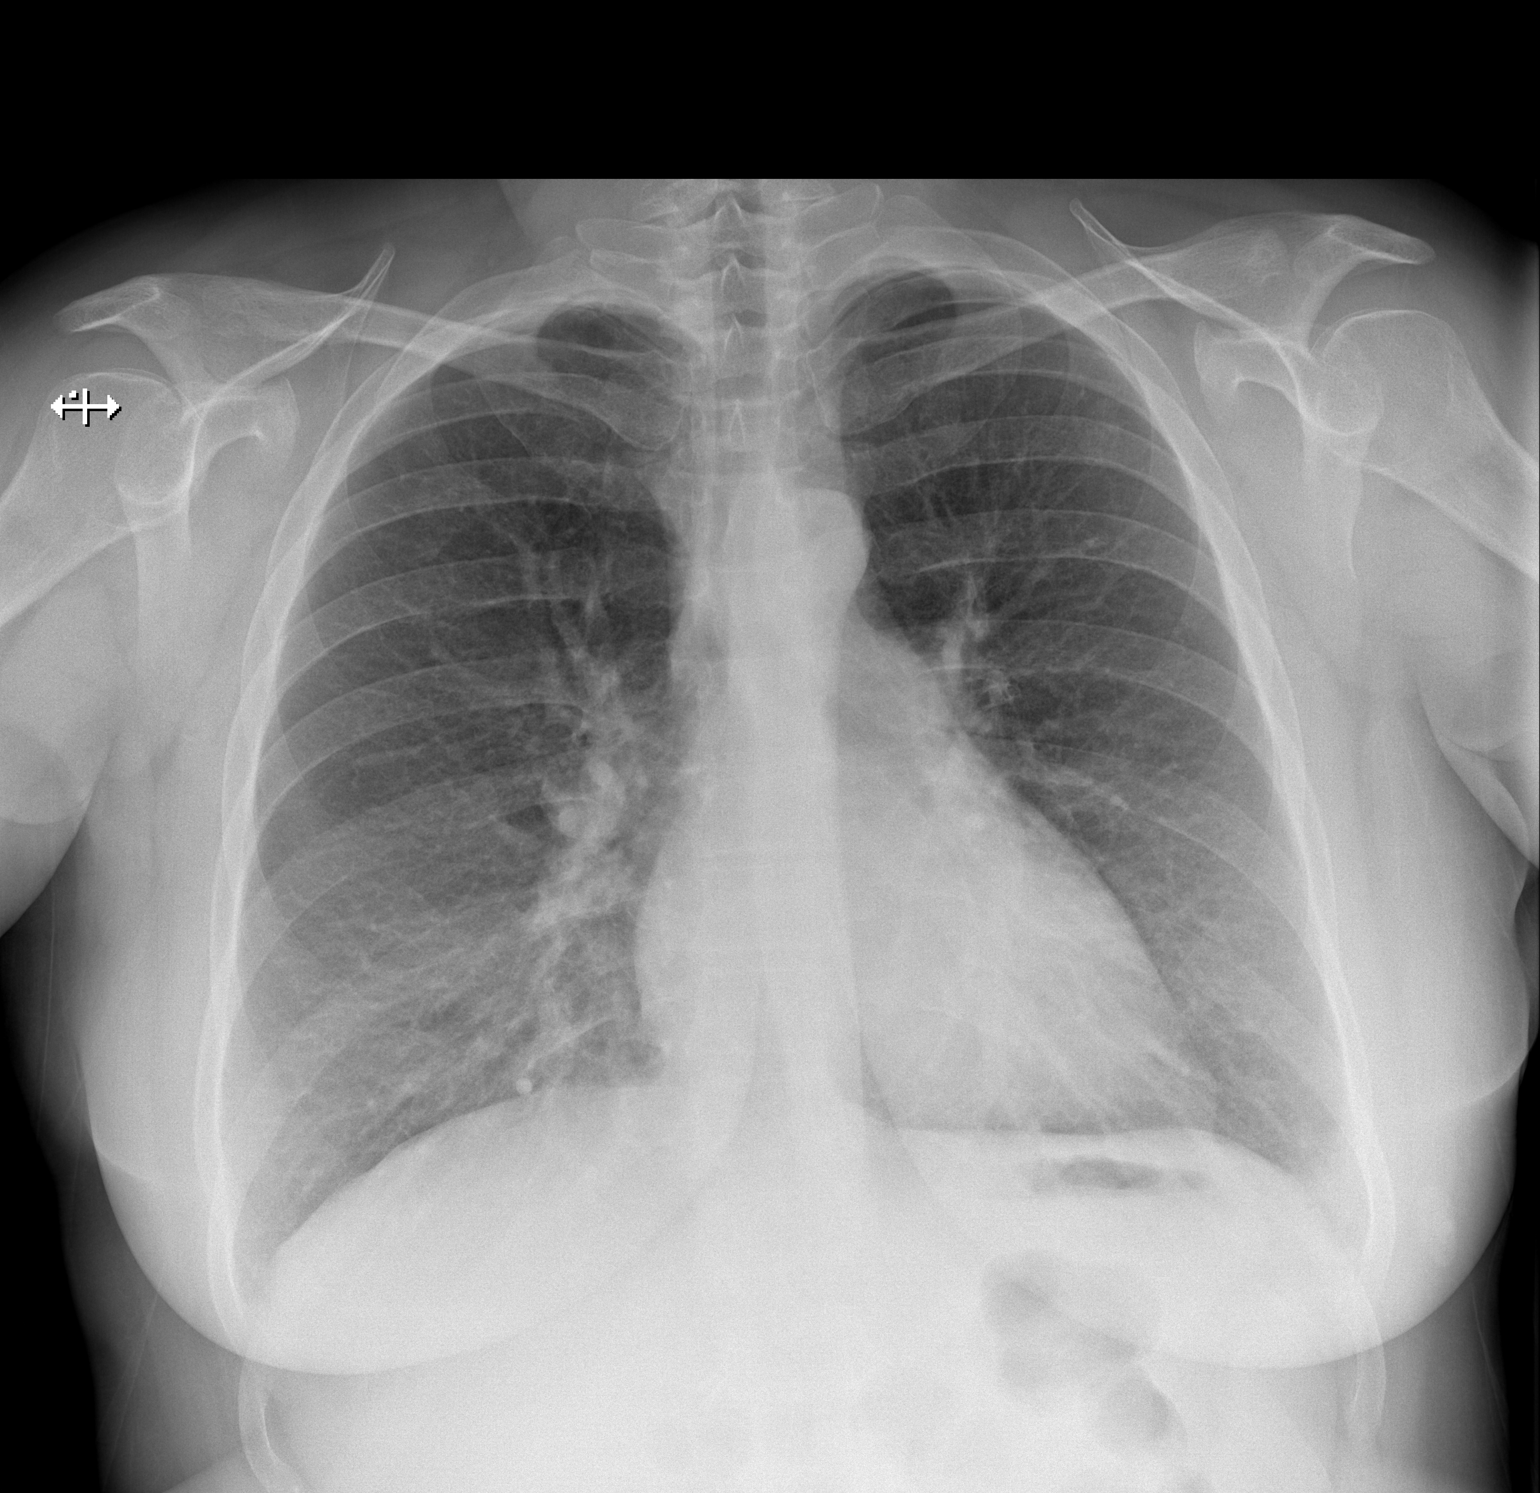

[w chest lat]
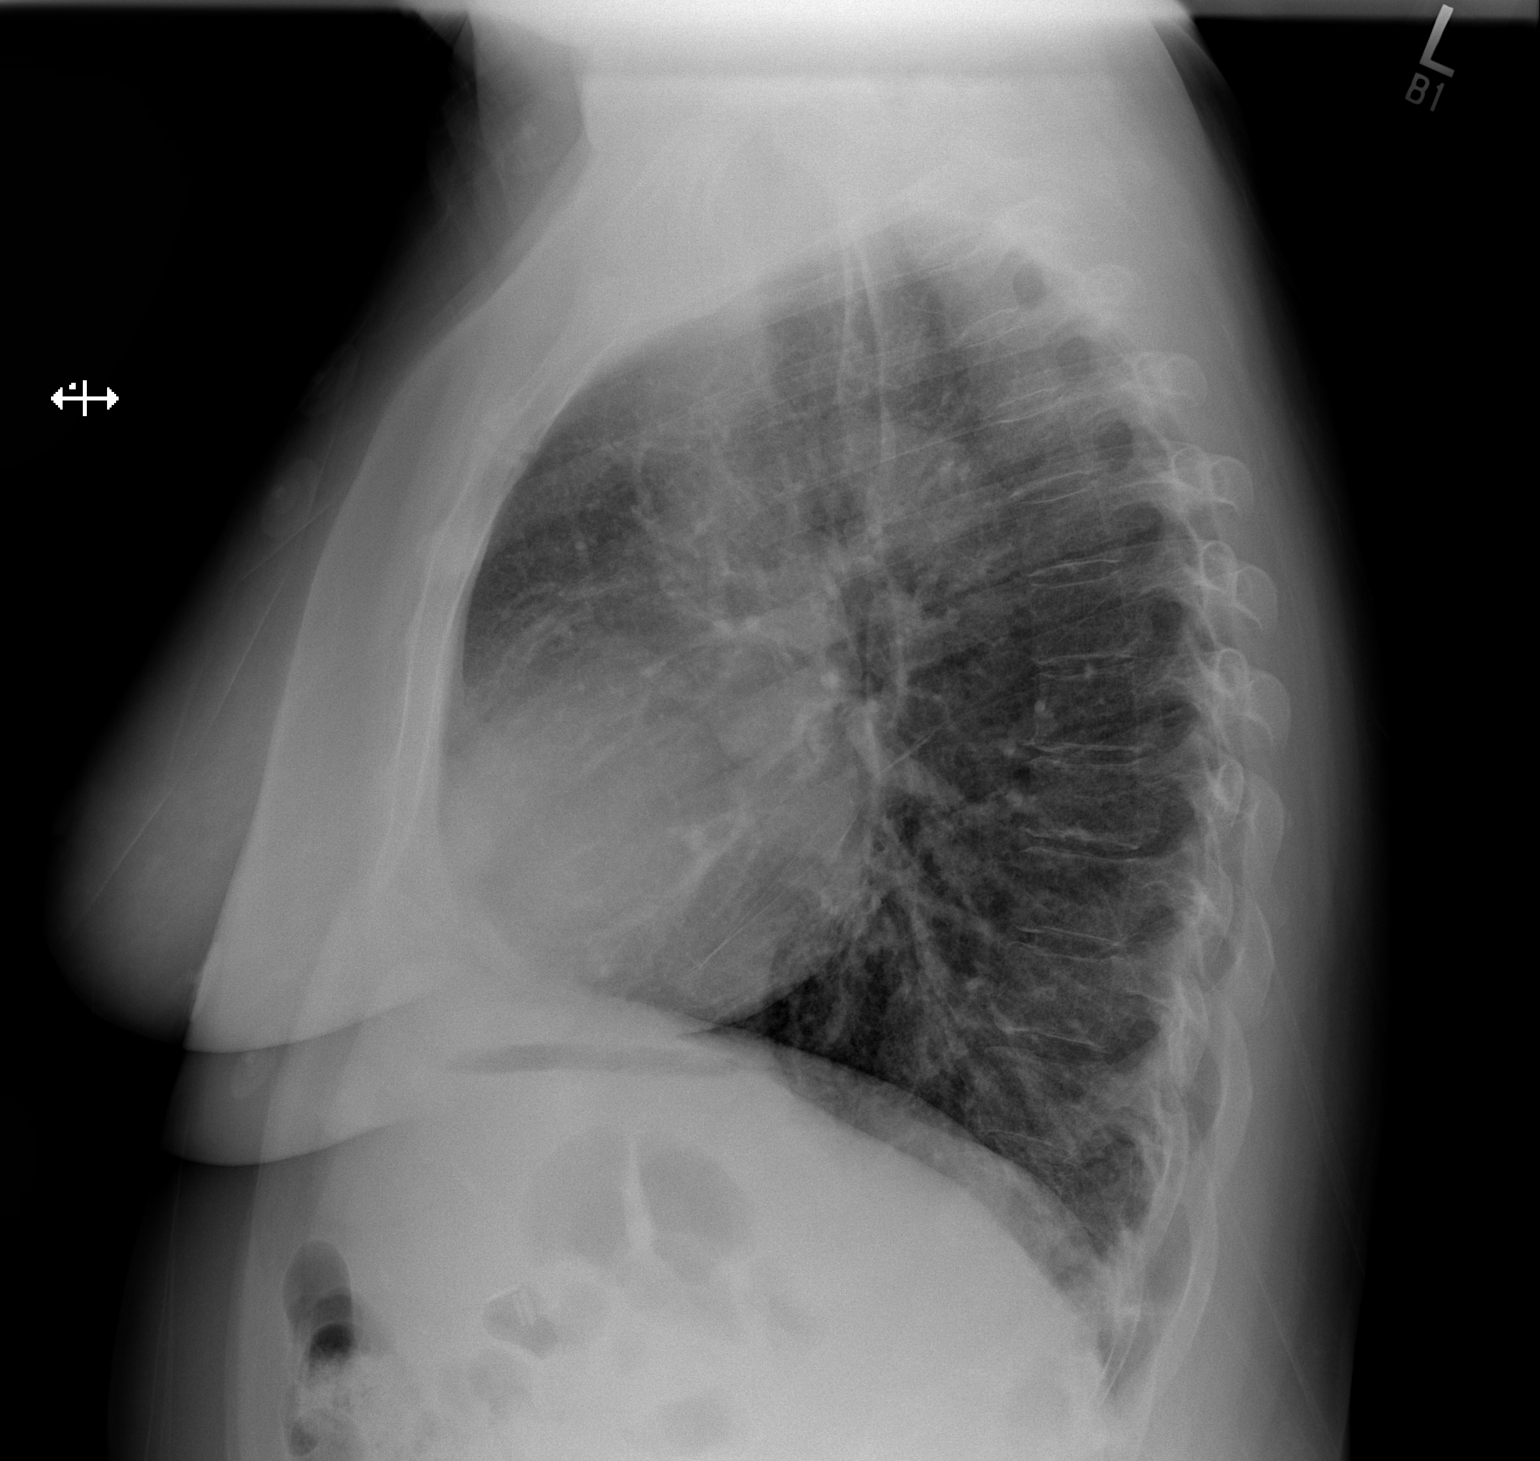

[2 of 2 positions shown; findings below may reference images not displayed]

FINDINGS: The heart size and mediastinal contours are within normal limits.
Both lungs are clear. The visualized skeletal structures are
unremarkable.
IMPRESSION: No active cardiopulmonary disease.

## 2020-05-18 IMAGING — CT CT ABD-PELV W/ CM
2 of 5 series · 15 of 46 positions shown, 17 images · IV contrast (Omni 300)
Comparison: None.

CLINICAL DATA: Left lower quadrant abdominal pain. Constipation.
Blood in stools.

EXAM:
CT ABDOMEN AND PELVIS WITH CONTRAST
TECHNIQUE: Multidetector CT imaging of the abdomen and pelvis was performed
using the standard protocol following bolus administration of
intravenous contrast.
CONTRAST:  100mL OMNIPAQUE IOHEXOL 300 MG/ML  SOLN

[Series 3: a/p w/ 5mm · axial · 0.83mm/px · z∈[+983,+1438]mm · 12 of 103 slices shown, 14 images]
[im 6/103  soft-tissue]
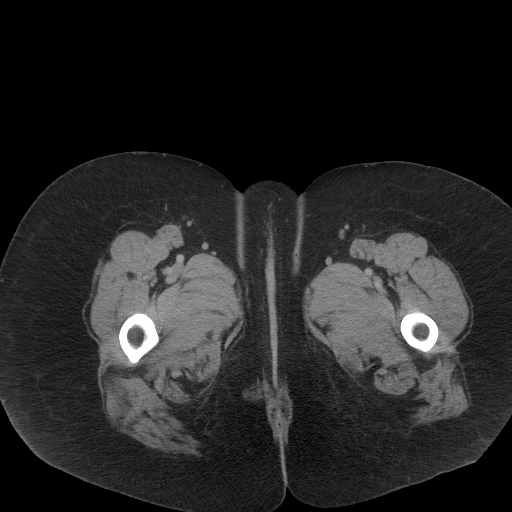
[im 6/103  bone]
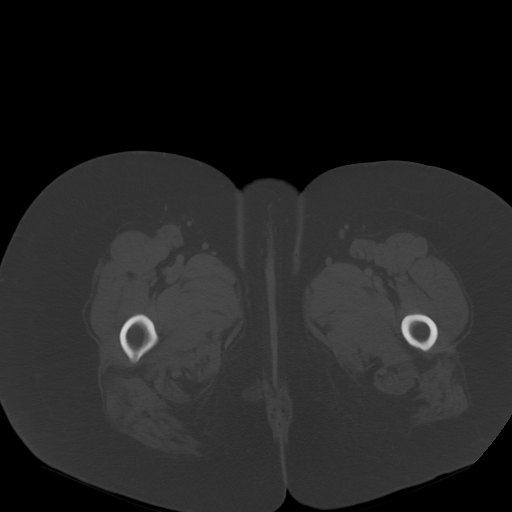
[im 18/103  soft-tissue]
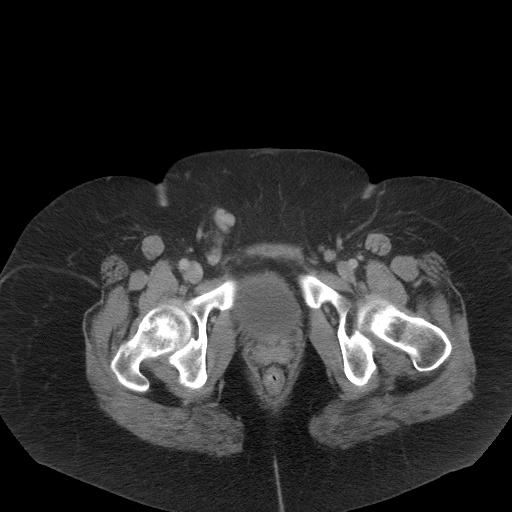
[im 23/103  soft-tissue]
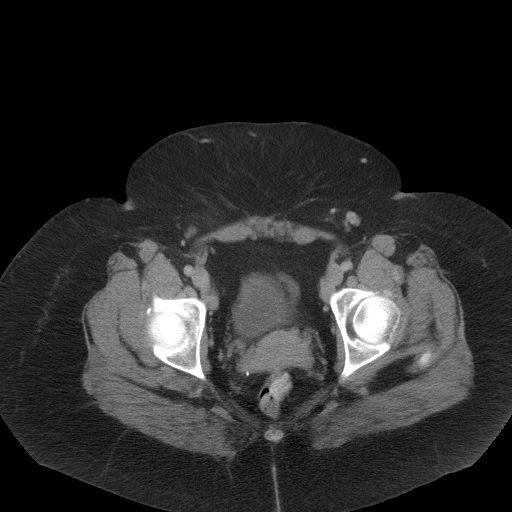
[im 29/103  soft-tissue]
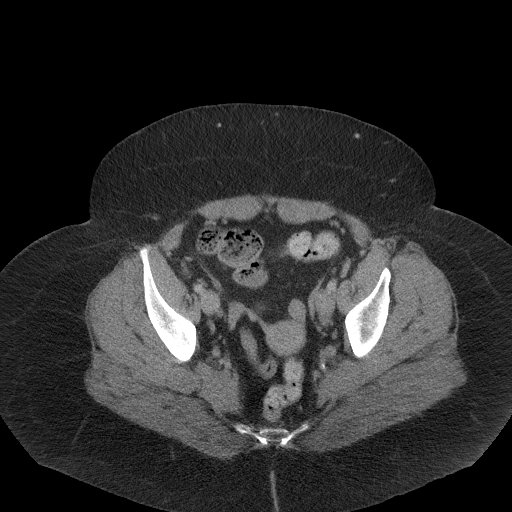
[im 40/103  soft-tissue]
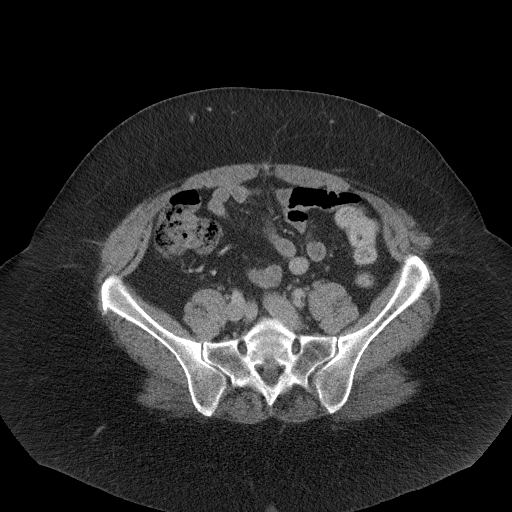
[im 46/103  soft-tissue]
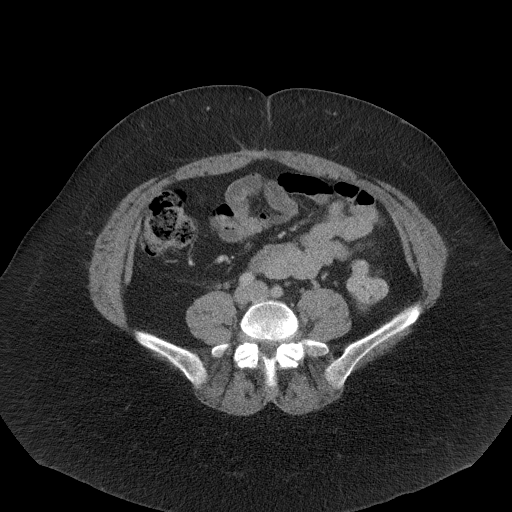
[im 57/103  soft-tissue]
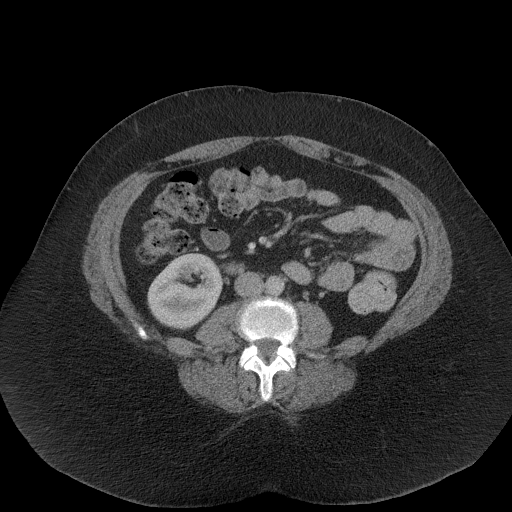
[im 63/103  soft-tissue]
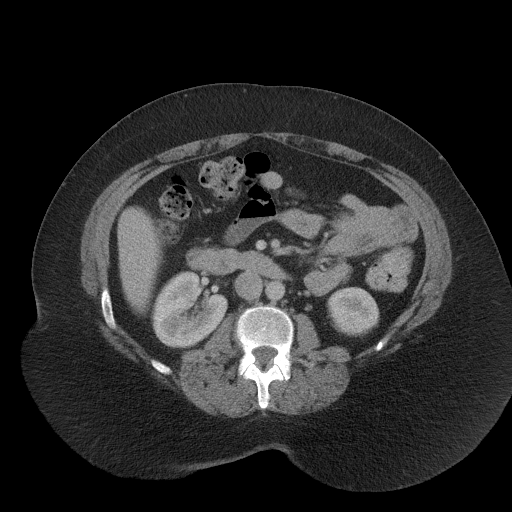
[im 74/103  soft-tissue]
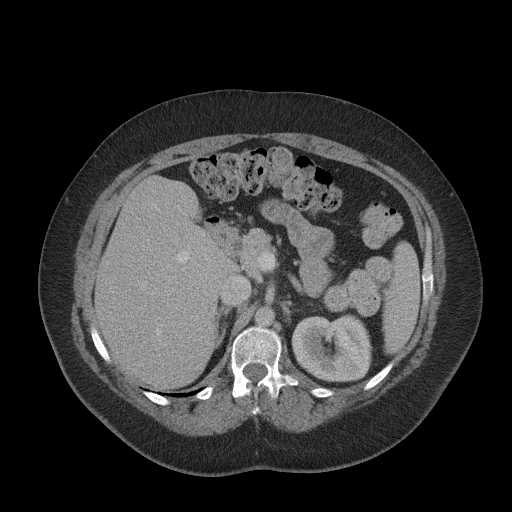
[im 74/103  bone]
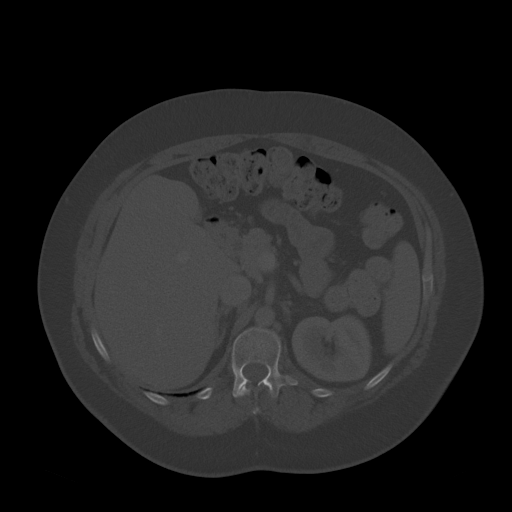
[im 80/103  soft-tissue]
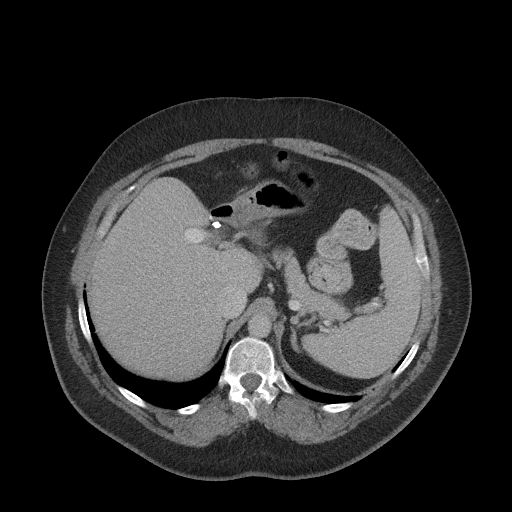
[im 86/103  soft-tissue]
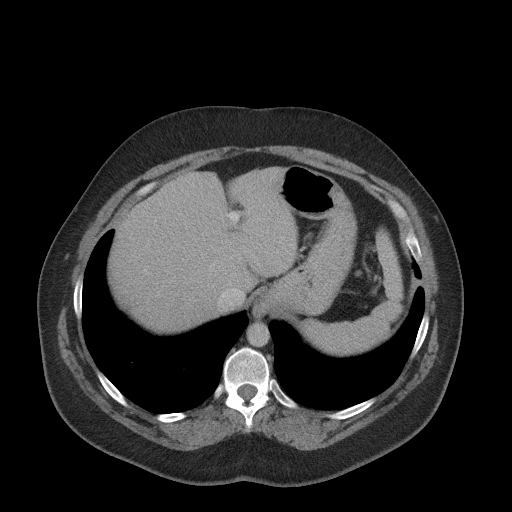
[im 97/103  soft-tissue]
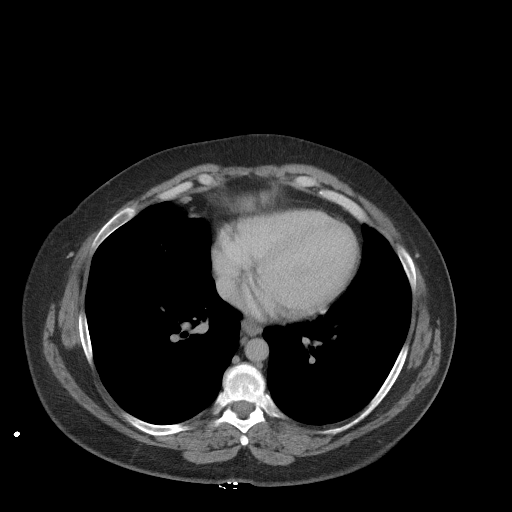

[Series 6: a/p w/ cor · coronal · 0.78mm/px · 3 of 177 slices shown]
[im 59/177  soft-tissue]
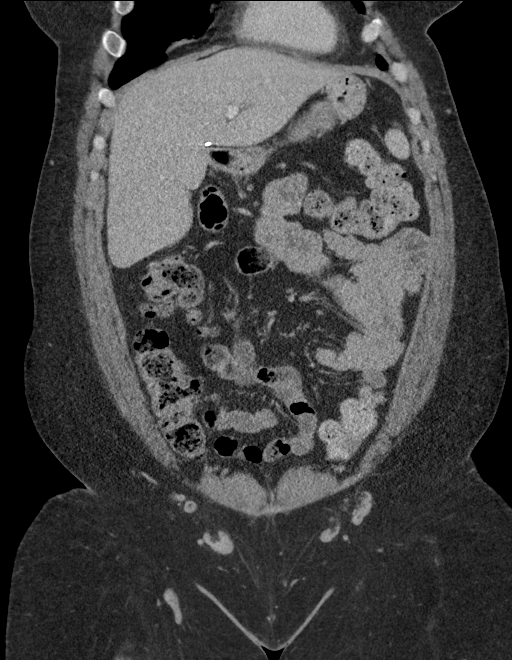
[im 79/177  soft-tissue]
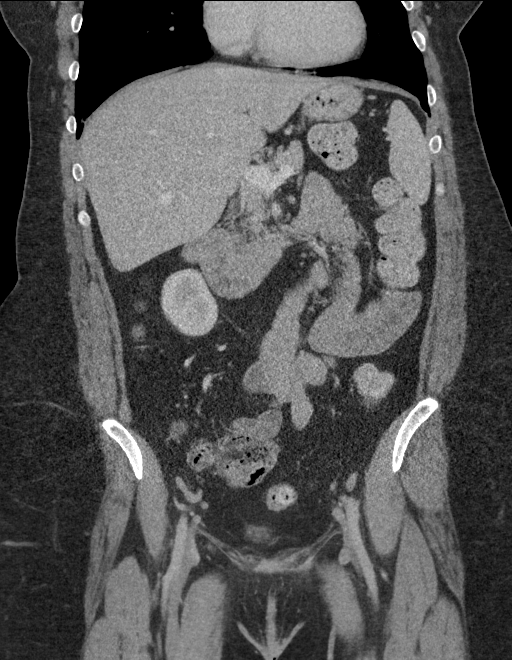
[im 98/177  soft-tissue]
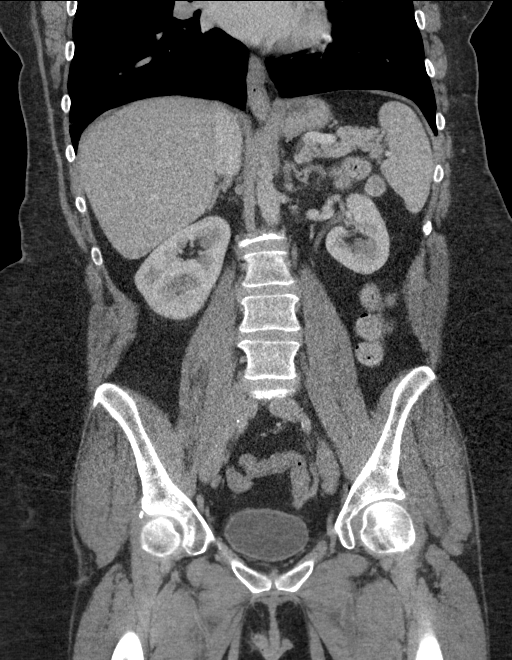

[15 of 46 positions shown; findings below may reference images not displayed]

FINDINGS: Lower chest: The lung bases are clear without focal nodule, mass, or
airspace disease. Heart size is normal. No significant pleural or
pericardial effusion is present.

Hepatobiliary: No focal liver abnormality is seen. Status post
cholecystectomy. No biliary dilatation.

Pancreas: Unremarkable. No pancreatic ductal dilatation or
surrounding inflammatory changes.

Spleen: Normal in size without focal abnormality.

Adrenals/Urinary Tract: The adrenal glands are normal bilaterally.
Kidneys and ureters are within normal limits. There is no stone or
mass lesion. No obstruction is present. The urinary bladder is
within normal limits.

Stomach/Bowel: Stomach and duodenum are within normal limits. Small
bowel is unremarkable. Terminal ileum is within normal limits. The
appendix is visualized and normal. The ascending and transverse
colon are normal. Descending colon is normal. Sigmoid colon is
partially collapsed. No focal lesions are present. There is no
obstruction.

Vascular/Lymphatic: Scattered atherosclerotic calcifications are
present in the distal aorta and proximal iliac vessels. There is no
aneurysm. Mildly prominent lymph nodes are present at the inguinal
regions bilaterally, right greater than left. These are ovoid and
appears benign. No significant intra-abdominal or retroperitoneal
adenopathy is present.

Reproductive: Uterus and bilateral adnexa are unremarkable.

Other: No abdominal wall hernia or abnormality. No abdominopelvic
ascites.

Musculoskeletal: Slight degenerative retrolisthesis present at L5-S1
with a vacuum disc. Mild endplate changes are present at L2-3.
Vertebral body heights and alignment are otherwise normal.
IMPRESSION: 1. Normal appearance of the small and large bowel without acute or
focal lesion to explain left lower quadrant pain. Constipation, or
blood in stool.
2. Prominent inguinal lymph nodes bilaterally, right greater than
left. These are likely benign

## 2021-01-10 ENCOUNTER — Emergency Department (INDEPENDENT_AMBULATORY_CARE_PROVIDER_SITE_OTHER)
Admission: EM | Admit: 2021-01-10 | Discharge: 2021-01-10 | Disposition: A | Discharge status: Home / Self Care | Payer: 59 | Source: Home / Self Care

## 2021-01-10 DIAGNOSIS — J0100 Acute maxillary sinusitis, unspecified: Secondary | ICD-10-CM | POA: Diagnosis not present

## 2021-01-10 DIAGNOSIS — H6502 Acute serous otitis media, left ear: Secondary | ICD-10-CM

## 2021-01-10 DIAGNOSIS — H9202 Otalgia, left ear: Secondary | ICD-10-CM

## 2021-01-10 MED ORDER — PREDNISONE 20 MG PO TABS: ORAL_TABLET | ORAL | 0 refills | Status: AC

## 2021-01-10 MED ORDER — AMOXICILLIN-POT CLAVULANATE 875-125 MG PO TABS: 1.0000 | ORAL_TABLET | Freq: Two times a day (BID) | ORAL | 0 refills | Status: AC

## 2021-01-10 NOTE — ED Triage Notes (Signed)
Pt c/o ear fullness x 10 days. Went to see PCP Monday who tried to flush ear, no relief. Says feels worse. Sometimes shooting pain. Carbamide peroxide drops prn since Monday.

## 2021-01-10 NOTE — ED Provider Notes (Signed)
[facility]    CSN: [id number] Arrival date & time: 01/10/21  6712      History   Chief Complaint Chief Complaint  Patient presents with   Otalgia    LT    HPI Crystal Fuentes is a 50 y.o. female.   HPI 50 year old female presents with left ear fullness x10 days, reports was evaluated by PCP on Monday who did attempted ear lavage with no relief.  Patient reports symptoms are worse with occasional shooting pain.  Patient reports using carbamide peroxide since Monday.  Past Medical History:  Diagnosis Date   Arthritis    Bipolar disorder ([place])    Carpal tunnel syndrome    COPD (chronic obstructive pulmonary disease) ([facility])    Heart murmur    "I was told I might have one"   Migraine    Hx:   Plantar fasciitis    Pneumonia    Trigger finger     Patient Active Problem List   Diagnosis Date Noted   Tobacco abuse 08/08/2014   Atypical chest pain 08/08/2014   COPD exacerbation ([place]) 08/07/2014   Acute respiratory failure with hypoxia ([place]) 08/07/2014     01/23/2013    Past Surgical History:  Procedure Laterality Date   ADENOIDECTOMY  1970's   CARPAL TUNNEL RELEASE Right 03/2014   CESAREAN SECTION  1991   CHOLECYSTECTOMY N/A 02/01/2013   Procedure: LAPAROSCOPIC CHOLECYSTECTOMY WITH INTRAOPERATIVE CHOLANGIOGRAM;  Surgeon: Imogene Burn. Georgette Dover, MD;  Location: [facility];  Service: General;  Laterality: N/A;   COLONOSCOPY  [id number]   DIAGNOSTIC LAPAROSCOPY     LAPAROSCOPIC LYSIS OF ADHESIONS N/A 02/01/2013   Procedure: LAPAROSCOPIC LYSIS OF ADHESIONS;  Surgeon: Imogene Burn. Georgette Dover, MD;  Location: [facility];  Service: General;  Laterality: N/A;   TUBAL LIGATION  1991    OB History   No obstetric history on file.      Home Medications    Prior to Admission medications   Medication Sig Start Date End Date Taking? Authorizing Provider  amoxicillin-clavulanate (AUGMENTIN) 875-125 MG tablet Take 1 tablet by mouth every 12 (twelve) hours. 01/10/21  Yes Eliezer Lofts, FNP   predniSONE (DELTASONE) 20 MG tablet Take 3 tabs PO daily x 5 days. 01/10/21  Yes Eliezer Lofts, FNP  albuterol (PROVENTIL) (2.5 MG/3ML) 0.083% nebulizer solution Take 3 mLs (2.5 mg total) by nebulization every 6 (six) hours as needed for wheezing or shortness of breath. 09/15/17   Hedges, Dellis Filbert, PA-C  buPROPion (WELLBUTRIN SR) 150 MG 12 hr tablet Take 150 mg by mouth 2 (two) times daily.    [provider]  busPIRone (BUSPAR) 5 MG tablet Take 5 mg by mouth 2 (two) times daily.    [provider]  cyclobenzaprine (FLEXERIL) 10 MG tablet Take 1 tablet (10 mg total) by mouth 2 (two) times daily as needed for muscle spasms. 08/28/16   Ashley Murrain, NP  diclofenac (VOLTAREN) 50 MG EC tablet Take 1 tablet (50 mg total) by mouth 2 (two) times daily. 08/28/16   Ashley Murrain, NP  fexofenadine (ALLEGRA) 30 MG tablet Take 30 mg by mouth daily.    [provider]  fluticasone (FLONASE) 50 MCG/ACT nasal spray Place 2 sprays into both nostrils daily. 09/19/17   Couture, Cortni S, PA-C  naproxen (NAPROSYN) 500 MG tablet Take 1 tablet (500 mg total) by mouth 2 (two) times daily with a meal. 05/19/17   Fawze, Mina A, PA-C  omeprazole (PRILOSEC) 20 MG capsule Take 20 mg by  mouth daily.    [provider]  ondansetron (ZOFRAN) 4 MG tablet Take 1 tablet (4 mg total) by mouth every 8 (eight) hours as needed for nausea or vomiting. 09/19/17   Couture, Cortni S, PA-C  polyethylene glycol (MIRALAX) packet Take 17 g by mouth daily. 09/05/18   Volanda Napoleon, PA-C  tiotropium (SPIRIVA HANDIHALER) 18 MCG inhalation capsule Place 1 capsule (18 mcg total) into inhaler and inhale daily. 09/15/17   Okey Regal, PA-C    Family History Family History  Problem Relation Age of Onset   Heart disease Mother    Hypertension Mother    Hyperlipidemia Mother    Arthritis Mother    Asthma Mother    Cancer Father    Hypertension Father    Hyperlipidemia Father    Hypertension Sister     Hyperlipidemia Sister    Hypertension Brother    Hyperlipidemia Brother     Social History Social History   Tobacco Use   Smoking status: Former    Pack years: 0.00    Types: Cigarettes   Smokeless tobacco: Never  Vaping Use   Vaping Use: Never used  Substance Use Topics   Alcohol use: No   Drug use: No     Allergies   Chantix [varenicline], Lyrica [pregabalin], and Gabapentin   Review of Systems Review of Systems  Constitutional: Negative.   HENT:  Positive for ear pain, facial swelling and sinus pressure.   Eyes: Negative.   Respiratory: Negative.    Cardiovascular: Negative.   Gastrointestinal: Negative.   Genitourinary: Negative.   Musculoskeletal: Negative.   Skin: Negative.   Neurological: Negative.     Physical Exam Triage Vital Signs ED Triage Vitals  Enc Vitals Group     BP 01/10/21 1017 (!) 162/81     Pulse Rate 01/10/21 1017 80     Resp 01/10/21 1017 18     Temp 01/10/21 1017 98.3 F (36.8 C)     Temp Source 01/10/21 1017 Oral     SpO2 01/10/21 1017 94 %     Weight --      Height --      Head Circumference --      Peak Flow --      Pain Score 01/10/21 1018 0     Pain Loc --      Pain Edu? --      Excl. in [place]? --    No data found.  Updated Vital Signs BP 126/81 Comment: 126/81 first BP entered in error  Pulse 80   Temp 98.3 F (36.8 C) (Oral)   Resp 18   SpO2 94%    Physical Exam Vitals and nursing note reviewed.  Constitutional:      General: She is not in acute distress.    Appearance: Normal appearance. She is obese. She is ill-appearing.  HENT:     Head: Normocephalic and atraumatic.     Right Ear: Hearing, tympanic membrane and ear canal normal.     Left Ear: A middle ear effusion is present. There is mastoid tenderness. Tympanic membrane is erythematous.     Ears:     Comments: Eustachian tube dysfunction noted bilaterally  Left TM: Opaque serous effusions noted    Nose:     Right Turbinates: Not enlarged.     Left  Turbinates: Not enlarged.     Right Sinus: Maxillary sinus tenderness present.     Left Sinus: Maxillary sinus tenderness present.  Mouth/Throat:     Lips: Pink.     Pharynx: Oropharynx is clear. Uvula midline. No pharyngeal swelling or oropharyngeal exudate.  Cardiovascular:     Rate and Rhythm: Normal rate and regular rhythm.     Pulses: Normal pulses.     Heart sounds: Normal heart sounds.  Pulmonary:     Effort: Pulmonary effort is normal.     Breath sounds: Normal breath sounds.     Comments: No adventitious breath sounds noted Musculoskeletal:        General: Normal range of motion.     Cervical back: Normal range of motion and neck supple. No tenderness.  Lymphadenopathy:     Cervical: No cervical adenopathy.  Skin:    General: Skin is warm and dry.  Neurological:     General: No focal deficit present.     Mental Status: She is alert and oriented to person, place, and time.  Psychiatric:        Mood and Affect: Mood normal.        Behavior: Behavior normal.     UC Treatments / Results  Labs (all labs ordered are listed, but only abnormal results are displayed) Labs Reviewed - No data to display  EKG   Radiology No results found.  Procedures Procedures (including critical care time)  Medications Ordered in UC Medications - No data to display  Initial Impression / Assessment and Plan / UC Course  I have reviewed the triage vital signs and the nursing notes.  Pertinent labs & imaging results that were available during my care of the patient were reviewed by me and considered in my medical decision making (see chart for details).     MDM: 1.  Left otalgia, 2.  Left acute otitis media, 3.  Subacute maxillary sinusitis.  Patient discharged home, hemodynamically stable. Final Clinical Impressions(s) / UC Diagnoses   Final diagnoses:  Otalgia of left ear  Acute serous otitis media of left ear, recurrence not specified  Subacute maxillary sinusitis      Discharge Instructions      Advised patient to take medication as directed with food to completion.  Encourage patient increase daily water intake while taking this medication.     ED Prescriptions     Medication Sig Dispense Auth. Provider   amoxicillin-clavulanate (AUGMENTIN) 875-125 MG tablet Take 1 tablet by mouth every 12 (twelve) hours. 14 tablet Eliezer Lofts, FNP   predniSONE (DELTASONE) 20 MG tablet Take 3 tabs PO daily x 5 days. 15 tablet Eliezer Lofts, FNP      PDMP not reviewed this encounter.   Eliezer Lofts, [place] 01/10/21 1109

## 2021-01-10 NOTE — Discharge Instructions (Addendum)
Advised patient to take medication as directed with food to completion.  Encourage patient increase daily water intake while taking this medication.
